# Patient Record
Sex: Female | Born: 1945 | Race: White | Hispanic: No
Health system: Northeastern US, Academic
[De-identification: ages and names within clinical notes are randomized; demographics above are authoritative.]

---

## 2020-04-06 ENCOUNTER — Ambulatory Visit: Admit: 2020-04-06 | Payer: PRIVATE HEALTH INSURANCE | Attending: Cardiovascular Disease | Primary: Internal Medicine

## 2020-04-06 ENCOUNTER — Encounter: Admit: 2020-04-06 | Payer: PRIVATE HEALTH INSURANCE | Attending: Cardiovascular Disease | Primary: Internal Medicine

## 2020-04-06 DIAGNOSIS — F488 Other specified nonpsychotic mental disorders: Secondary | ICD-10-CM

## 2020-04-06 DIAGNOSIS — I10 Essential (primary) hypertension: Secondary | ICD-10-CM

## 2020-04-06 DIAGNOSIS — M1990 Unspecified osteoarthritis, unspecified site: Secondary | ICD-10-CM

## 2020-04-06 DIAGNOSIS — E7800 Pure hypercholesterolemia, unspecified: Secondary | ICD-10-CM

## 2020-04-06 DIAGNOSIS — I471 SVT (supraventricular tachycardia): Secondary | ICD-10-CM

## 2020-04-06 DIAGNOSIS — T7840XA Allergy, unspecified, initial encounter: Secondary | ICD-10-CM

## 2020-04-06 DIAGNOSIS — R079 Chest pain, unspecified: Secondary | ICD-10-CM

## 2020-04-06 DIAGNOSIS — I499 Cardiac arrhythmia, unspecified: Secondary | ICD-10-CM

## 2020-04-06 DIAGNOSIS — R001 Bradycardia, unspecified: Secondary | ICD-10-CM

## 2020-04-06 DIAGNOSIS — R55 Syncope and collapse: Secondary | ICD-10-CM

## 2020-04-06 DIAGNOSIS — K219 Gastro-esophageal reflux disease without esophagitis: Secondary | ICD-10-CM

## 2020-04-06 DIAGNOSIS — F32A Depression: Secondary | ICD-10-CM

## 2020-04-06 DIAGNOSIS — I517 Cardiomegaly: Secondary | ICD-10-CM

## 2020-04-06 DIAGNOSIS — M797 Fibromyalgia: Secondary | ICD-10-CM

## 2020-04-06 DIAGNOSIS — M8580 Other specified disorders of bone density and structure, unspecified site: Secondary | ICD-10-CM

## 2020-04-06 DIAGNOSIS — I679 Cerebrovascular disease, unspecified: Secondary | ICD-10-CM

## 2020-04-06 NOTE — Progress Notes
TELEPHONE VISIT: For this visit the clinician and patient were present via telephone (audio only).Patient counseled on available options for visit type; Patient elected telephone visit; Patient consent given for telephone visit: YesPatient Identity was confirmed during this call.? Other individuals actively participating in the telephone encounter and their name/relation to the patient: NoneTotal time spent in medical telephone consultation: 23 minutesBecause this visit was completed over telephone, a hands-on physical exam was not performed.? Patient understands and knows to call back if condition changes. 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 In [facility], she was given Metorpolol 12.5mg  QD and Losartan 12.5mg  QD for elevated BPs there, but has since has dizziness. Her home SBPs have been in the 120s.Problem list:   Patient Active Problem List ? Diagnosis Date Noted ? Multiple closed fractures of metatarsal bone of left foot with routine healing, subsequent encounter 07/16/2017 ? Multiple closed fractures of metatarsal bone of left foot, initial encounter 06/25/2017 ? Palpitations 06/05/2017 ? SVT (supraventricular tachycardia) (HC Code) 06/26/2016 ? CVD (cerebrovascular disease) 06/26/2016 ? Bradycardia 06/26/2016 ? Fibromyalgia ? ? Depression ? ? GERD (gastroesophageal reflux disease) ? ? Arthritis ? ? HTN (hypertension) ? ? Osteopenia  Medications: reviewedAssessment74yo WF with HTN, prior SVT, and mild-moderate carotid stenosis, has had dizziness due to Metoprolol and Losartan?Plan1. Continue ASA and Amlodipine without Metoprolol or Losartan for now2. Home BPs and call if elevated3. F/u soon with Dr Massie Maroon. F/u here in 1 year or sooner if needed.

## 2020-04-07 DIAGNOSIS — I471 Supraventricular tachycardia: Secondary | ICD-10-CM

## 2020-04-19 ENCOUNTER — Telehealth: Admit: 2020-04-19 | Payer: PRIVATE HEALTH INSURANCE | Attending: Cardiovascular Disease | Primary: Internal Medicine

## 2020-04-19 ENCOUNTER — Encounter: Admit: 2020-04-19 | Payer: PRIVATE HEALTH INSURANCE | Attending: Cardiovascular Disease | Primary: Internal Medicine

## 2020-04-19 MED ORDER — FUROSEMIDE 40 MG TABLET
40 mg | ORAL_TABLET | Freq: Every day | ORAL | 1 refills | Status: AC
Start: 2020-04-19 — End: 2020-05-12

## 2020-04-19 NOTE — Telephone Encounter
Please answer for Dr. Parke Simmers away this week.I spoke to this pt who is C/o last 3-4 days notes swelling in her lower legs, ankles and feet. Pt has noted a 5 lb weight gain over the last 5 days, she said it is all fluid. Pt is not on any Diuretics. She has an OV on 04/26/20, but the weight gain is very uncomfortable.Please advise.

## 2020-04-19 NOTE — Telephone Encounter
Pt has leg and feet swelling she would like to see doctor

## 2020-04-19 NOTE — Telephone Encounter
I sent prescription for Lasix 40 milligrams daily.  She should take it for 1 week.  She should also follow low-salt diet and elevate legs.  She has appointment here in 1 week.

## 2020-04-19 NOTE — Telephone Encounter
Spoke to this pt and gave her the message from Dr. Plavec

## 2020-04-26 ENCOUNTER — Ambulatory Visit: Admit: 2020-04-26 | Payer: PRIVATE HEALTH INSURANCE | Attending: Cardiovascular Disease | Primary: Internal Medicine

## 2020-04-26 ENCOUNTER — Encounter: Admit: 2020-04-26 | Payer: PRIVATE HEALTH INSURANCE | Attending: Cardiovascular Disease | Primary: Internal Medicine

## 2020-04-26 DIAGNOSIS — I517 Cardiomegaly: Secondary | ICD-10-CM

## 2020-04-26 DIAGNOSIS — M1990 Unspecified osteoarthritis, unspecified site: Secondary | ICD-10-CM

## 2020-04-26 DIAGNOSIS — F488 Other specified nonpsychotic mental disorders: Secondary | ICD-10-CM

## 2020-04-26 DIAGNOSIS — R079 Chest pain, unspecified: Secondary | ICD-10-CM

## 2020-04-26 DIAGNOSIS — I499 Cardiac arrhythmia, unspecified: Secondary | ICD-10-CM

## 2020-04-26 DIAGNOSIS — R55 Syncope and collapse: Secondary | ICD-10-CM

## 2020-04-26 DIAGNOSIS — K219 Gastro-esophageal reflux disease without esophagitis: Secondary | ICD-10-CM

## 2020-04-26 DIAGNOSIS — I10 Essential (primary) hypertension: Secondary | ICD-10-CM

## 2020-04-26 DIAGNOSIS — R600 Localized edema: Secondary | ICD-10-CM

## 2020-04-26 DIAGNOSIS — E7800 Pure hypercholesterolemia, unspecified: Secondary | ICD-10-CM

## 2020-04-26 DIAGNOSIS — M8580 Other specified disorders of bone density and structure, unspecified site: Secondary | ICD-10-CM

## 2020-04-26 DIAGNOSIS — M797 Fibromyalgia: Secondary | ICD-10-CM

## 2020-04-26 DIAGNOSIS — T7840XA Allergy, unspecified, initial encounter: Secondary | ICD-10-CM

## 2020-04-26 DIAGNOSIS — F32A Depression: Secondary | ICD-10-CM

## 2020-04-26 MED ORDER — CYCLOSPORINE 0.05 % EYE DROPS IN A DROPPERETTE
0.05 % | Freq: Two times a day (BID) | OPHTHALMIC | Status: AC
Start: 2020-04-26 — End: ?

## 2020-04-26 NOTE — Progress Notes
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 In [facility], she was given Metoprolol 12.5mg  QD and Losartan 12.5mg  QD for elevated BPs there, but has since has dizziness. Her home SBPs have been in the 120s. Metoprolol and Losartan were stopped 04/06/2020 with significant improvement in her dizziness. More recently, she developed edema which has resolved with Furosemide.Problem list:Patient Active Problem List  Diagnosis Date Noted ? Multiple closed fractures of metatarsal bone of left foot with routine healing, subsequent encounter 07/16/2017 ? Multiple closed fractures of metatarsal bone of left foot, initial encounter 06/25/2017 ? Palpitations 06/05/2017 ? SVT (supraventricular tachycardia) (HC Code) 06/26/2016 ? CVD (cerebrovascular disease) 06/26/2016 ? Bradycardia 06/26/2016 ? Fibromyalgia  ? Depression  ? GERD (gastroesophageal reflux disease)  ? Arthritis  ? HTN (hypertension)  ? Osteopenia  ? Localized edema 04/26/2020 Medications:Current Outpatient Medications Medication Sig Dispense Refill ? amLODIPine (NORVASC) 5 mg tablet TAKE 1 TABLET BY MOUTH EVERY DAY 90 tablet 3 ? aspirin 81 MG EC tablet Take 81 mg by mouth daily.   ? calcium carbonate (OS-CAL) 600 mg (1,500 mg) Tab tablet Take 1,200 mg by mouth daily.   ? CALCIUM CARBONATE/VITAMIN D3 (VITAMIN D-3 ORAL) Take by mouth.   ? celecoxib (CELEBREX) 100 mg capsule Take 1 capsule (100 mg total) by mouth daily. 90 capsule 0 ? cyanocobalamin (VITAMIN B-12) 1000 MCG tablet Take 1,000 mcg by mouth daily.   ? cycloSPORINE (RESTASIS) 0.05 % ophthalmic emulsion Place 1 drop into both eyes 2 (two) times daily.   ? esomeprazole (NEXIUM) 40 MG capsule Take 40 mg by mouth daily.   ? furosemide (LASIX) 40 mg tablet Take 1 tablet (40 mg total) by mouth daily. 30 tablet 0 ? hydrOXYchloroQUINE (PLAQUENIL) 200 mg tablet Take 1 tablet (200 mg total) by mouth daily. 90 tablet 3 ? alendronate (FOSAMAX) 70 MG tablet TAKE 1 TAB(S) ONCE A WEEK ORALLY 28 DAY(S)  3 ? ALPRAZolam (XANAX) 0.25 MG tablet Take 0.25 mg by mouth nightly as needed.   ? diclofenac (VOLTAREN) 1 % Gel Apply topically.   ? MAGNESIUM CITRATE ORAL Take by mouth.   ? oxyCODONE-acetaminophen (PERCOCET) 5-325 mg per tablet Take 1 tablet by mouth every 6 (six) hours as needed. 30 tablet 0 ? oxyCODONE-acetaminophen (PERCOCET) 5-325 mg per tablet Take 1 tablet by mouth every 6 (six) hours as needed. 20 tablet 0 ? rOPINIRole (REQUIP) 0.25 MG tablet TALE 1 TAB(S) ORALLY TWICE DAILY  1 No current facility-administered medications for this visit.  Allergies:She is allergic to penicillins.The following ROS documentation is the transcribed Review of System completed by the patient at intake.Review of Systems Constitution: Negative for chills, decreased appetite, diaphoresis, fever, malaise/fatigue, weight gain and weight loss. Cardiovascular: Negative for chest pain, claudication, dyspnea on exertion, irregular heartbeat, leg swelling, near-syncope, orthopnea, palpitations, paroxysmal nocturnal dyspnea and syncope. Respiratory: Negative for cough, hemoptysis, shortness of breath, snoring and wheezing.  Neurological: Positive for dizziness.  Vital Signs: BP 126/68 (Site: l a, Position: Sitting, Cuff Size: Medium)  - Pulse (!) 56  - Temp 97.2 ?F (36.2 ?C)  - Ht 5' 7 (1.702 m)  - Wt 80 kg  - SpO2 98%  - BMI 27.63 kg/m? Wt Readings from Last 3 Encounters: 04/26/20 80 kg 08/13/19 77.1 kg  07/16/19 77.1 kg Physical Exam Constitutional: She appears well-developed and well-nourished. HENT: Head: Normocephalic and atraumatic. Eyes: Conjunctivae are normal. Neck: No JVD present. Cardiovascular: Normal rate and regular rhythm. No murmur heard.Pulmonary/Chest: Effort normal and breath sounds normal. Abdominal: Soft. Bowel sounds are normal. Musculoskeletal:       General: No edema. Neurological: She is alert. Skin: Skin is warm and dry. Psychiatric: She has a normal mood and affect. Labs:Lipid PanelLab Results Component Value Date  CHOL 163 11/28/2016  CHOL 181 09/22/2016  CHOL 197 09/29/2014 Lab Results Component Value Date  HDL 79 11/28/2016  HDL 76 45/40/9811  HDL 84 91/47/8295 Lab Results Component Value Date  LDL 67 11/28/2016  LDL 93 62/13/0865  LDL 98 78/46/9629 Lab Results Component Value Date  TRIG 84 11/28/2016  TRIG 62 09/22/2016  TRIG 73 09/29/2014 Lab Results Component Value Date  CHOLHDL 2.1 11/28/2016  CHOLHDL 2.4 09/22/2016  CHOLHDL 2 09/29/2014 CBCLab Results Component Value Date  WBC 6.4 06/18/2019  HGB 13.6 06/18/2019  HCT 40.5 06/18/2019  MCV 96.4 (H) 06/18/2019  PLT 354 06/18/2019 Liver Function TestsLab Results Component Value Date  ALT 21 06/18/2019  AST 18 06/18/2019  ALKPHOS 56 06/18/2019  BILITOT 0.3 06/18/2019 Chemistry  Chemistry  Lab Results Component Value Date  NA 137 06/18/2019  K 4.7 06/18/2019  CL 102 06/18/2019  CO2 26 06/18/2019  BUN 14 06/18/2019  CREATININE 0.84 06/18/2019  GLU 84 06/18/2019  Lab Results Component Value Date  CALCIUM 9.3 06/18/2019  ALKPHOS 56 06/18/2019  AST 18 06/18/2019  ALT 21 06/18/2019  BILITOT 0.3 06/18/2019  EKG: SB PVCResults for orders placed or performed in visit on 04/26/20 EKG (Clinic Performed) Result Value Ref Range  ECG - HEART RATE 56 bpm  ECG - QRS Interval 82 ms  ECG - QT Interval 408 ms  ECG - QTC Interval 388 ms ECG - P Axis 64 deg  ECG - QRS Axis 20 deg  ECG - T Wave Axis 57 deg  ECG -- P-R Interval 169 msec  ECG - SEVERITY Borderline ECG severity ECHO:Results for orders placed or performed during the hospital encounter of 12/24/17 Echo 2D Complete w Doppler and CFI if Indicated Contrast and or 3D Result Value Ref Range  Reported Biplane EF% 68 %  Narrative   * Quality of the study was good.* Normal left ventricular cavity size.  Normal left ventricular systolic function.  Asymmetric septal hypertrophy is noted.  LVEF calculated by biplane Simpson's was 68%.* Normal right ventricular cavity size and systolic function.* Aortic sclerosis without stenosis.  Trace aortic regurgitation.* Mild mitral regurgitation.* Mild tricuspid regurgitation.* No evidence of pericardial effusion.* No prior study available for comparison. Assessment74yo WF with?HTN, prior SVT, and mild-moderate carotid stenosis, has improved dizziness without Metoprolol and Losartan, and resolved edema with Furosemide. Her edema likely was due to a higher salt intake during a recent trip, in addition to her usual Amlodipine but also the hot weather.Plan1. Continue current medications for now but would consider reducing Furosemide2. Echocardiogram, then arrange follow up I encouraged the patient to call me if they have any questions and more importantly any new symptoms.

## 2020-04-27 DIAGNOSIS — I679 Cerebrovascular disease, unspecified: Secondary | ICD-10-CM

## 2020-05-10 ENCOUNTER — Inpatient Hospital Stay: Admit: 2020-05-10 | Discharge: 2020-05-10 | Payer: PRIVATE HEALTH INSURANCE | Primary: Internal Medicine

## 2020-05-10 DIAGNOSIS — R600 Localized edema: Secondary | ICD-10-CM

## 2020-05-10 DIAGNOSIS — I10 Essential (primary) hypertension: Secondary | ICD-10-CM

## 2020-05-11 ENCOUNTER — Telehealth: Admit: 2020-05-11 | Payer: PRIVATE HEALTH INSURANCE | Attending: Cardiovascular Disease | Primary: Internal Medicine

## 2020-05-11 ENCOUNTER — Encounter: Admit: 2020-05-11 | Payer: PRIVATE HEALTH INSURANCE | Attending: Cardiovascular Disease | Primary: Internal Medicine

## 2020-05-11 ENCOUNTER — Encounter: Admit: 2020-05-11 | Payer: PRIVATE HEALTH INSURANCE | Attending: Internal Medicine | Primary: Internal Medicine

## 2020-05-11 DIAGNOSIS — Z1231 Encounter for screening mammogram for malignant neoplasm of breast: Secondary | ICD-10-CM

## 2020-05-11 NOTE — Telephone Encounter
Echo is unremarkable. F/u in 1 year

## 2020-05-12 MED ORDER — FUROSEMIDE 40 MG TABLET
40 mg | ORAL_TABLET | 4 refills | Status: AC
Start: 2020-05-12 — End: 2021-03-27

## 2020-06-21 ENCOUNTER — Encounter: Admit: 2020-06-21 | Payer: PRIVATE HEALTH INSURANCE | Attending: Cardiovascular Disease | Primary: Internal Medicine

## 2020-06-22 MED ORDER — AMLODIPINE 5 MG TABLET
5 mg | ORAL_TABLET | 4 refills | Status: AC
Start: 2020-06-22 — End: 2021-07-21

## 2020-07-06 ENCOUNTER — Ambulatory Visit: Admit: 2020-07-06 | Payer: PRIVATE HEALTH INSURANCE | Primary: Internal Medicine

## 2020-08-23 IMAGING — MG MAMMOGRAPHY SCREENING BILATERAL 3D TOMOSYNTHESIS WITH CAD
8 series · 8 of 24 positions shown · non-contrast
Comparison: 09/03/2018 
BREAST DENSITY: (Level C) The breasts are heterogeneously dense, which may 
obscure small masses.

MAMMOGRAPHY SCREENING BILATERAL 3D TOMOSYNTHESIS WITH CAD, 08/23/2020 [DATE]: 
CLINICAL INDICATION: Screening. Breast cancer mother. The 9 LEFT breast biopsy 3 
years ago.
TECHNIQUE: Bilateral oblique mediolateral and craniocaudal full field digital 
mammogram and 3-D Tomosynthesis were obtained.  In addition, computer-aided 
detection was utilized.

[R CC]
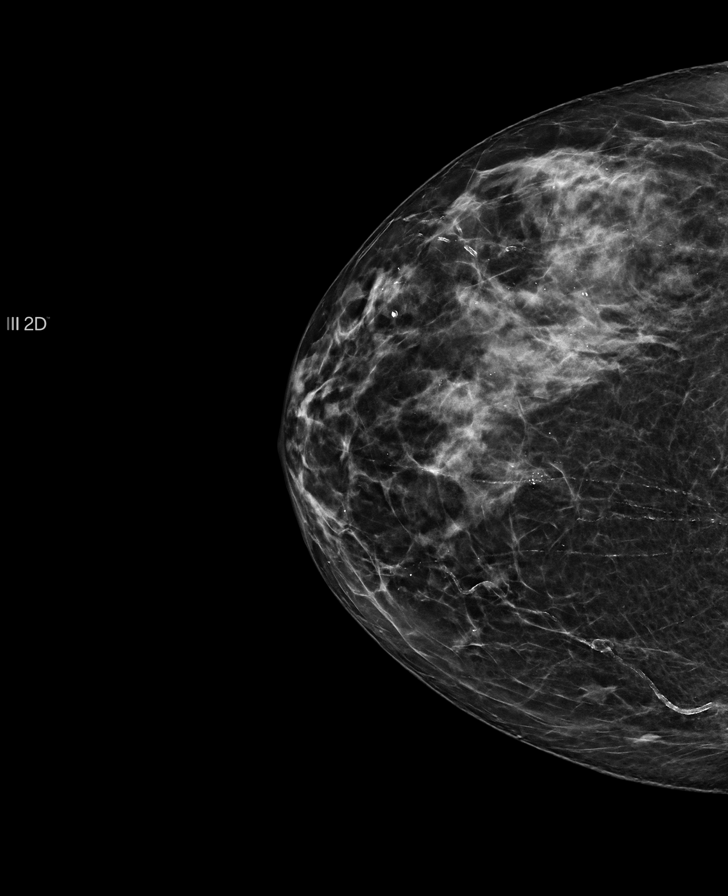

[L MLO]
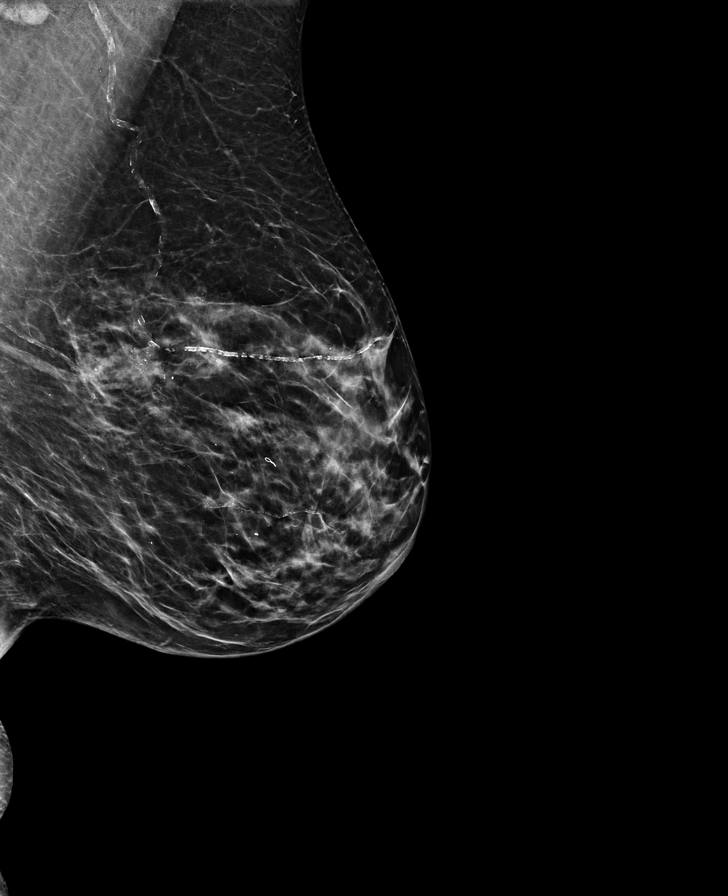

[L CC]
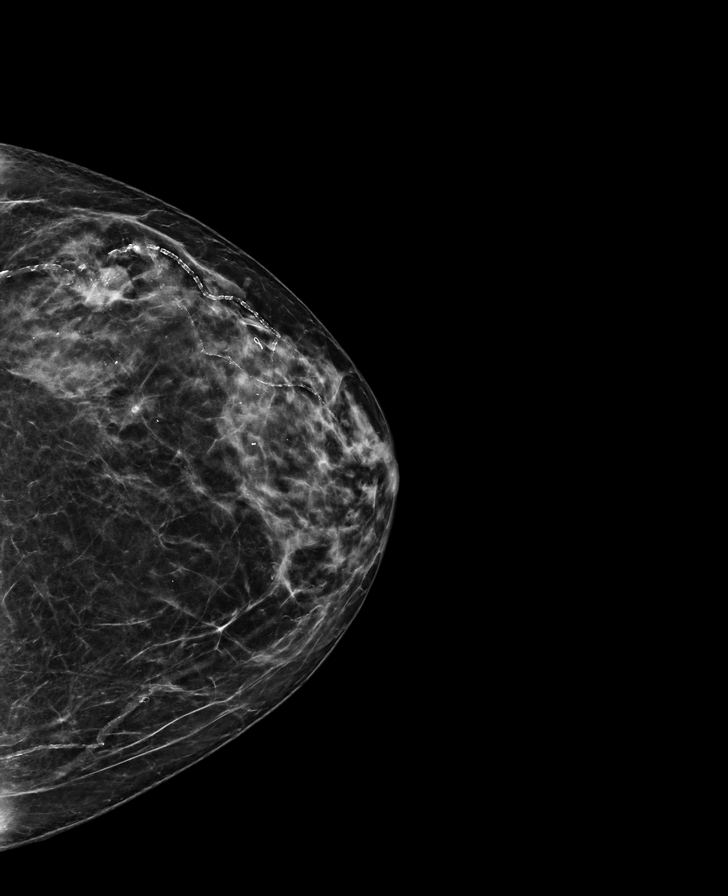

[R MLO]
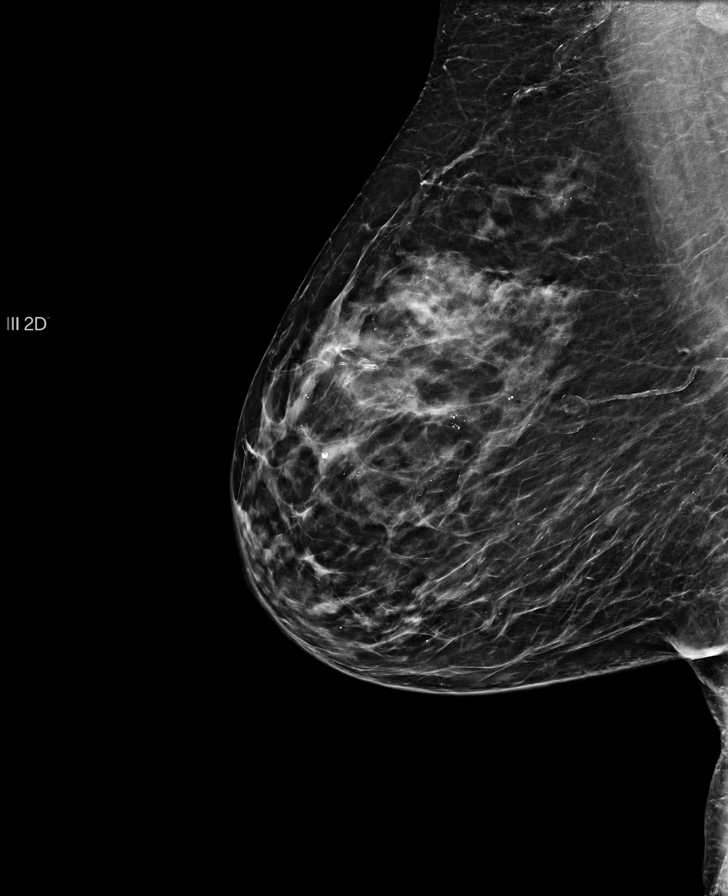

[R MLO tomo · tomo slice 37/73.0]
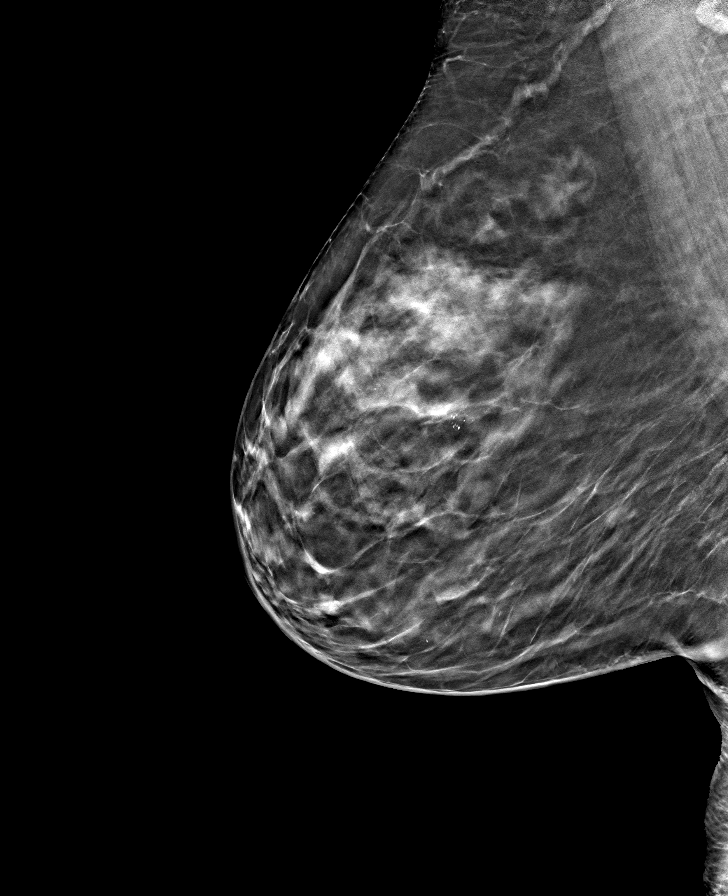

[R CC tomo · tomo slice 31/61.0]
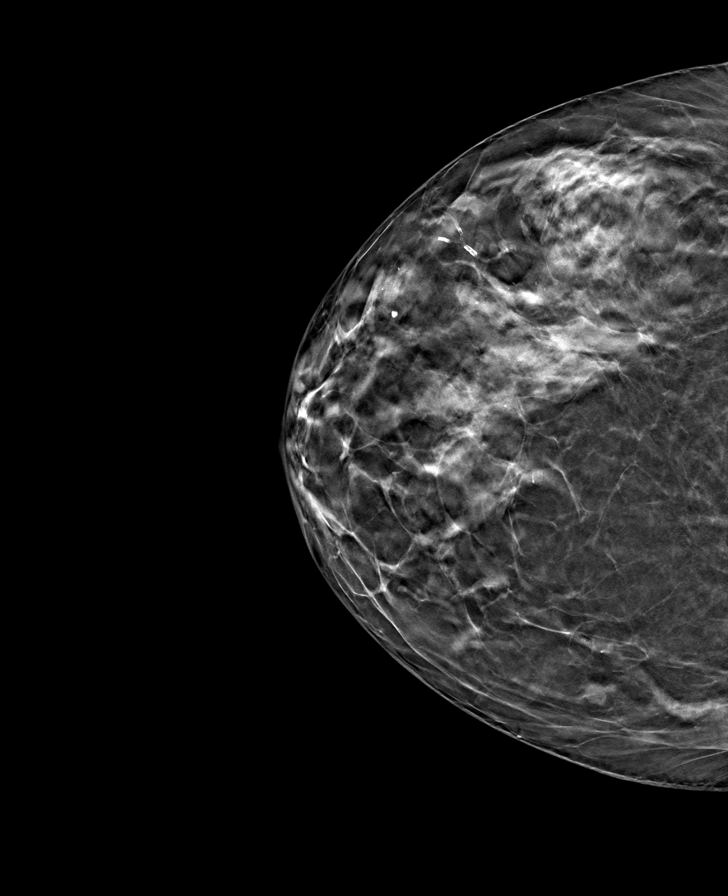

[L CC tomo · tomo slice 33/64.0]
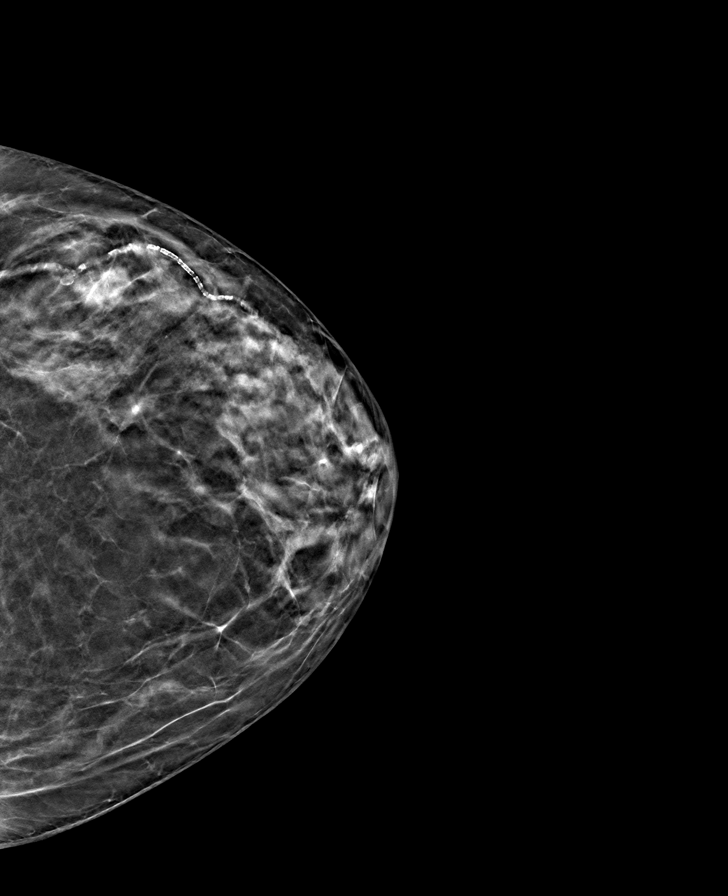

[L MLO tomo · tomo slice 37/72.0]
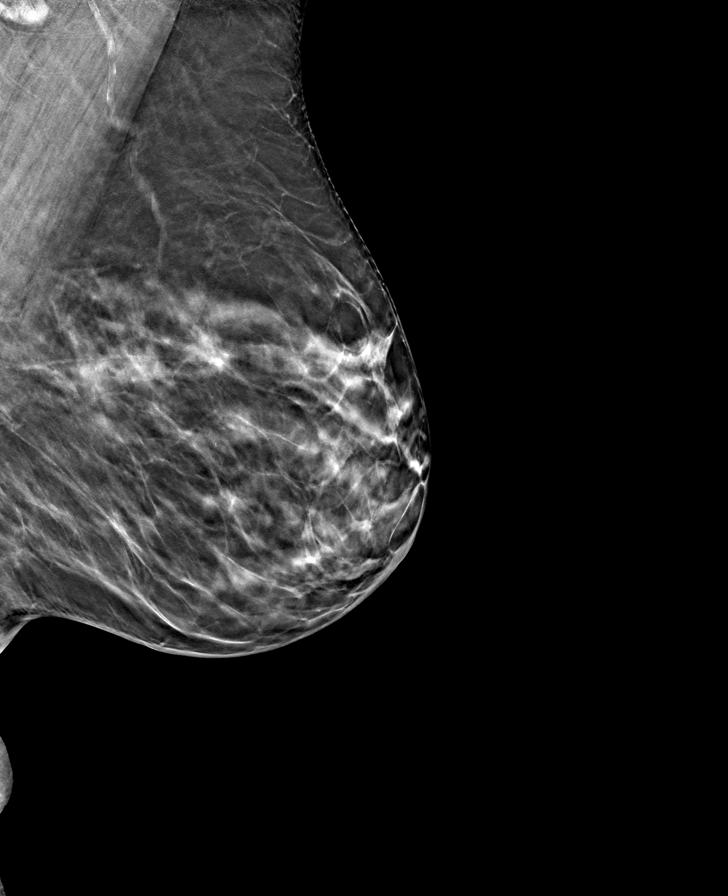

[8 of 24 positions shown; findings below may reference images not displayed]

FINDINGS: Developing cluster of calcifications in the lateral LEFT breast. 
Magnification views have been further evaluation. 
Cluster of calcifications medial to the midline of the RIGHT breast appears 
stable. Extensive, scattered benign-appearing calcifications. Arterial 
calcifications.
IMPRESSION: (BI-RADS 0) Incomplete. Further evaluation will be performed as discussed above, 
and the results will be reported separately.

## 2020-09-16 IMAGING — MG MAMMOGRAPHY DIAGNOSTIC LEFT 3D TOMOSYNTHESIS WITH CAD
3 series · 3 of 3 positions shown · non-contrast
Comparison: 08/23/2020 and dating back to 07/08/2007 
BREAST DENSITY: (Level C) The breasts are heterogeneously dense, which may 
obscure small masses.

MAMMOGRAPHY DIAGNOSTIC LEFT 3D TOMOSYNTHESIS WITH CAD, 09/16/2020 [DATE]: 
CLINICAL INDICATION: Callback from screening mammogram
TECHNIQUE: Unilateral left breast oblique medial lateral and craniocaudal full 
field digital mammogram and 3-D Tomosynthesis were obtained. In addition, 
computer-aided detection was utilized.

[L MLO]
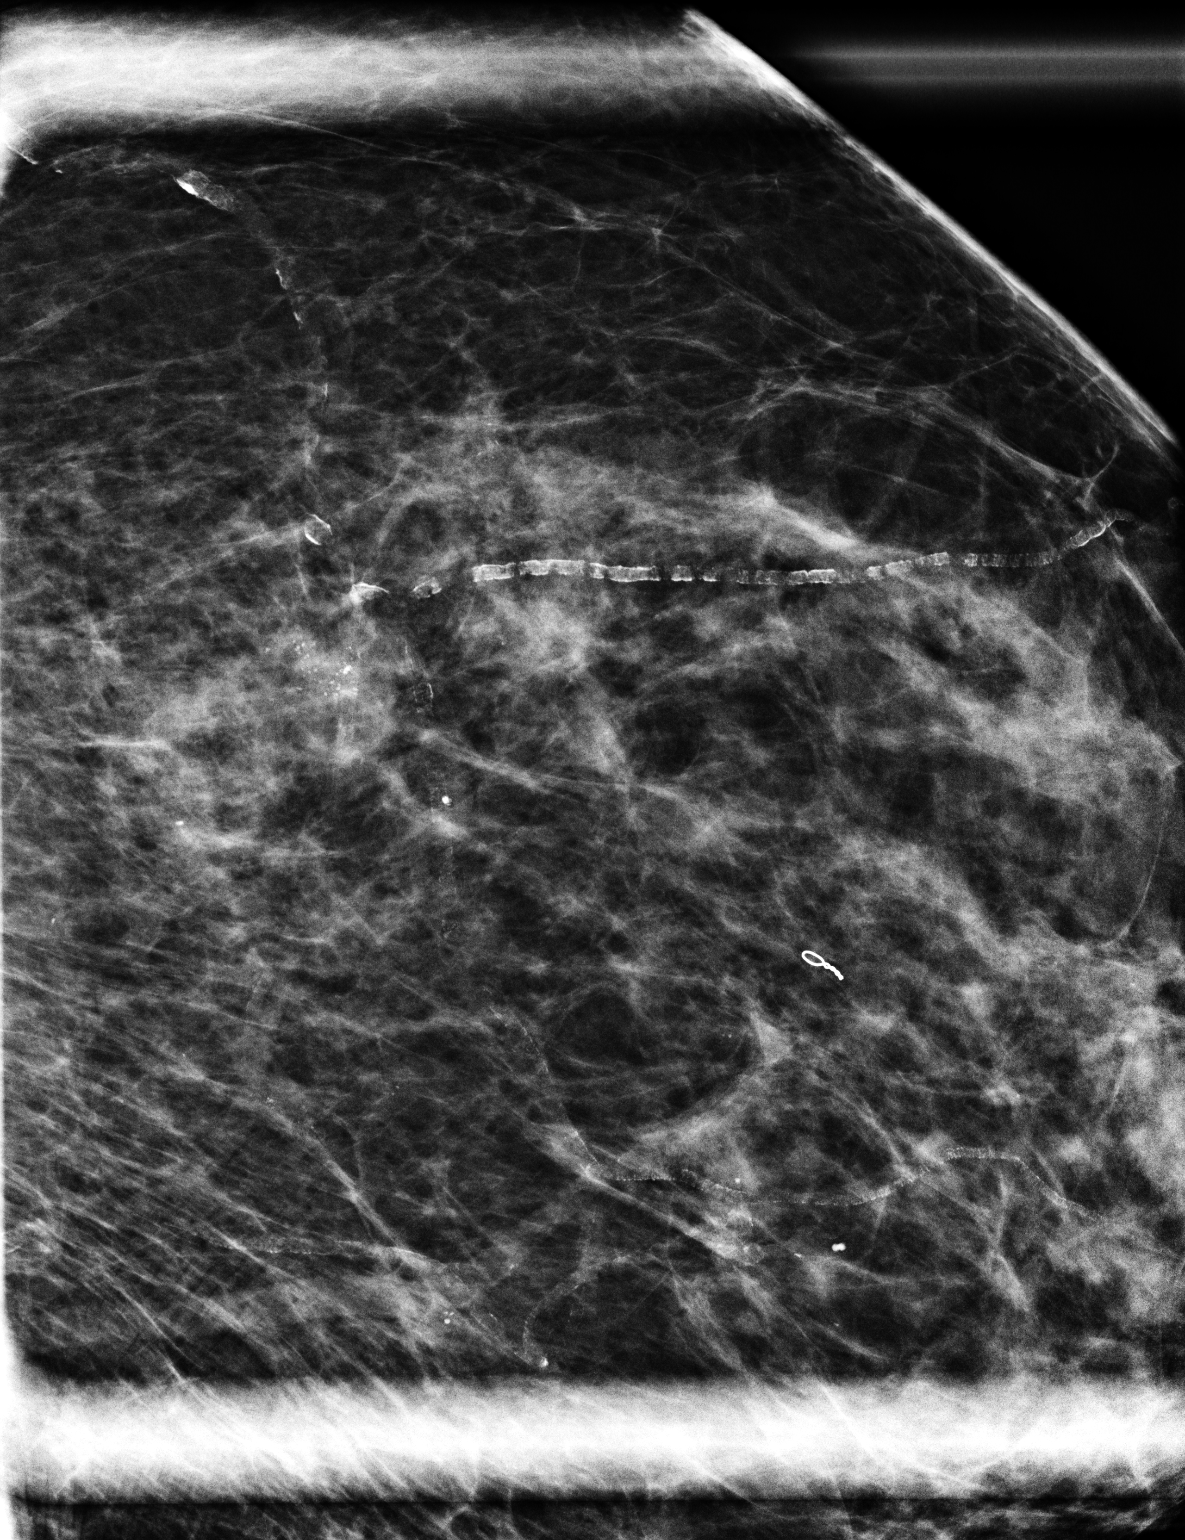

[L CC]
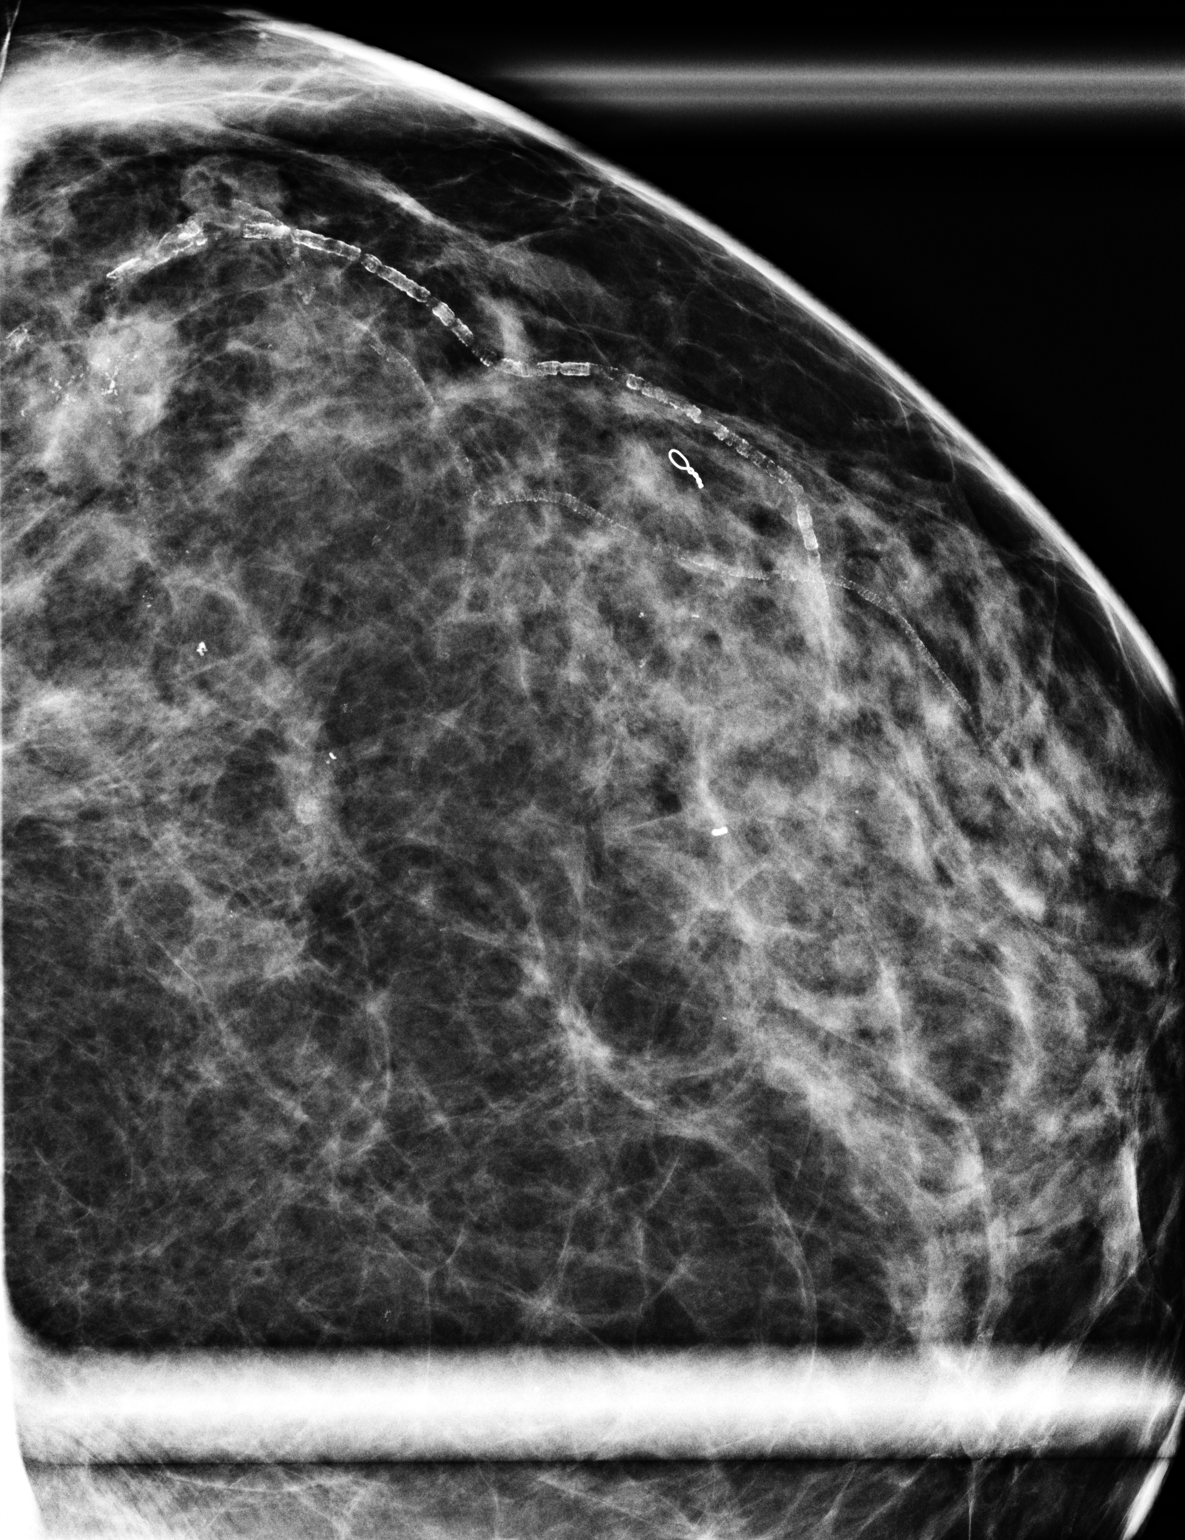

[L ML]
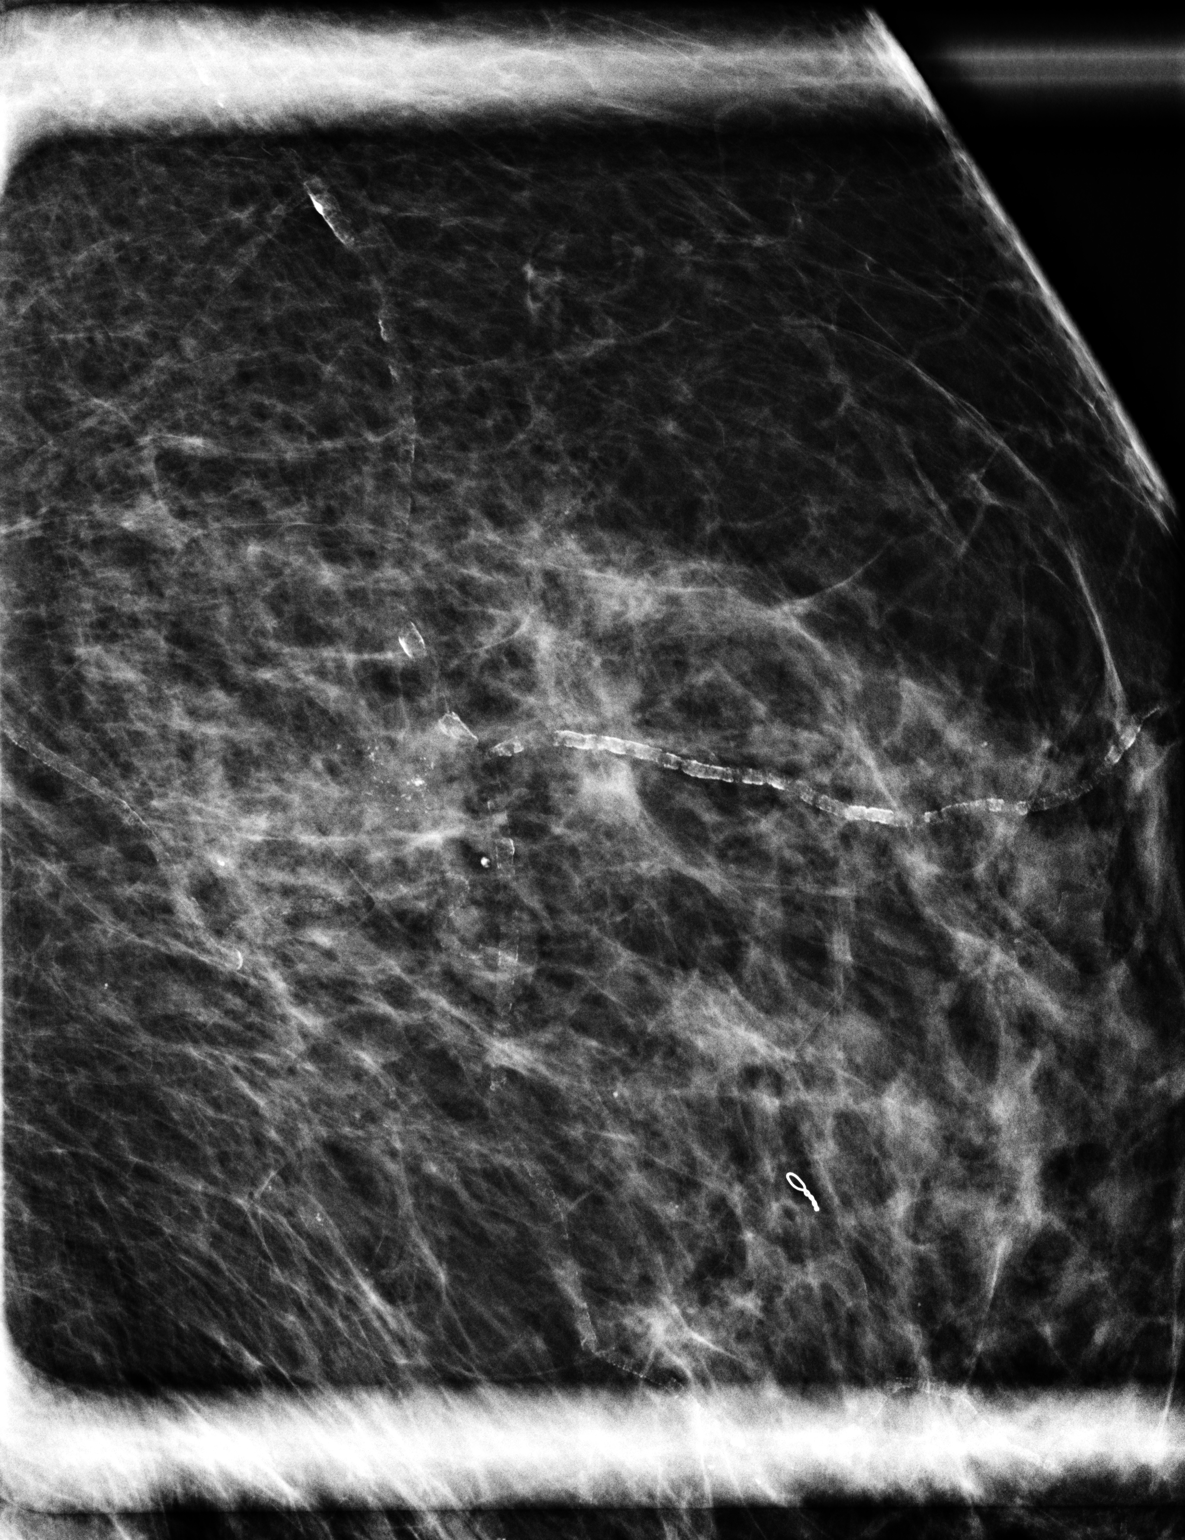

[3 of 3 positions shown; findings below may reference images not displayed]

FINDINGS: Again seen is the clustered microcalcifications within the lateral 
left breast nearly straight back from the nipple. These are located 
approximately 9 cm from the level of the nipple. Margination is somewhat 
amorphous. Stereotactic sampling is recommended.
IMPRESSION: (BI-RADS 4) Suspicious abnormality. Biopsy is recommended.

## 2021-03-21 ENCOUNTER — Telehealth: Admit: 2021-03-21 | Payer: PRIVATE HEALTH INSURANCE | Attending: Cardiovascular Disease | Primary: Internal Medicine

## 2021-03-21 NOTE — Telephone Encounter
Dr. Normajean Glasgow patient requested an appt via answering service. Patient states she has been experiencing headaches, dizziness, unsteady on her feet, tingling in her hands and feet. She saw her PCP on 5/16 and was advised to see her cardiologist as soon as possible. Her contact number is [phone].

## 2021-03-21 NOTE — Telephone Encounter
Spoke with ptShe reports dizziness and tingling in her hands and feetStates this happened last year but was not as severeShe reports symptoms have been intermittent and ongoing but have worsened within the past couple of weeksShe denies headachesDenies unilateral weakness, just states she feels unsteady (not using cane or walker)No difficulty with speechStates she is dizzy all day but notes her dizziness worsens when she standsShe takes amlodipine 5 mg QDStates she takes 1/2 tab lasix (20 mg) as needed for swellingReports recent psychosocial stressors (recent mastectomy, son's death)BP today 150/67, HR 61BP yesterday 155/77, HR 53Not adding salt to mealsHas upcoming OV on 6/15/22Would like to be seen soonerAlso endorses severe fleeting chest pain that occurred a few weeks agoReviewed to call 911 for s/s of stroke (ie sudden severe headache, speech difficulty, unilateral weakness)Please advise

## 2021-03-21 NOTE — Telephone Encounter
Brier, Sherie Don., MD  [facility] Nurse Triage Pool 7 minutes ago (11:37 AM) JBPlease have her call to move up the appt  Message text

## 2021-03-27 ENCOUNTER — Ambulatory Visit: Admit: 2021-03-27 | Payer: PRIVATE HEALTH INSURANCE | Attending: Cardiovascular Disease | Primary: Internal Medicine

## 2021-03-27 ENCOUNTER — Encounter: Admit: 2021-03-27 | Payer: PRIVATE HEALTH INSURANCE | Attending: Cardiovascular Disease | Primary: Internal Medicine

## 2021-03-27 DIAGNOSIS — I10 Essential (primary) hypertension: Secondary | ICD-10-CM

## 2021-03-27 DIAGNOSIS — M8580 Other specified disorders of bone density and structure, unspecified site: Secondary | ICD-10-CM

## 2021-03-27 DIAGNOSIS — R55 Syncope and collapse: Secondary | ICD-10-CM

## 2021-03-27 DIAGNOSIS — F32A Depression: Secondary | ICD-10-CM

## 2021-03-27 DIAGNOSIS — M797 Fibromyalgia: Secondary | ICD-10-CM

## 2021-03-27 DIAGNOSIS — T7840XA Allergy, unspecified, initial encounter: Secondary | ICD-10-CM

## 2021-03-27 DIAGNOSIS — I471 SVT (supraventricular tachycardia): Secondary | ICD-10-CM

## 2021-03-27 DIAGNOSIS — I499 Cardiac arrhythmia, unspecified: Secondary | ICD-10-CM

## 2021-03-27 DIAGNOSIS — I517 Cardiomegaly: Secondary | ICD-10-CM

## 2021-03-27 DIAGNOSIS — F488 Other specified nonpsychotic mental disorders: Secondary | ICD-10-CM

## 2021-03-27 DIAGNOSIS — K219 Gastro-esophageal reflux disease without esophagitis: Secondary | ICD-10-CM

## 2021-03-27 DIAGNOSIS — R42 Dizziness and giddiness: Secondary | ICD-10-CM

## 2021-03-27 DIAGNOSIS — R079 Chest pain, unspecified: Secondary | ICD-10-CM

## 2021-03-27 DIAGNOSIS — M1990 Unspecified osteoarthritis, unspecified site: Secondary | ICD-10-CM

## 2021-03-27 DIAGNOSIS — E7800 Pure hypercholesterolemia, unspecified: Secondary | ICD-10-CM

## 2021-03-27 MED ORDER — LEVOTHYROXINE 25 MCG TABLET
25 MCG | Status: AC
Start: 2021-03-27 — End: ?

## 2021-03-27 MED ORDER — HYDROCHLOROTHIAZIDE 25 MG TABLET
25 mg | ORAL_TABLET | Freq: Every day | ORAL | 4 refills | Status: AC
Start: 2021-03-27 — End: 2022-03-13

## 2021-03-27 MED ORDER — LOSARTAN 25 MG TABLET
25 mg | Status: AC
Start: 2021-03-27 — End: 2021-03-27

## 2021-03-27 NOTE — Progress Notes
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 She did have a left mastectomy for breast cancer 12/2020 with reconstruction. Her son died unexpectedly 01-25-2021 at 51. A autopsy is pending.Problem list:Patient Active Problem List  Diagnosis Date Noted ? Multiple closed fractures of metatarsal bone of left foot with routine healing, subsequent encounter 07/16/2017 ? Multiple closed fractures of metatarsal bone of left foot, initial encounter 06/25/2017 ? Palpitations 06/05/2017 ? SVT (supraventricular tachycardia) (HC Code) (HC CODE) (HC Code) 06/26/2016 ? CVD (cerebrovascular disease) 06/26/2016 ? Bradycardia 06/26/2016 ? Fibromyalgia  ? Depression  ? GERD (gastroesophageal reflux disease)  ? Arthritis  ? HTN (hypertension)  ? Osteopenia  ? Dizziness 03/27/2021 ? Localized edema 04/26/2020 Medications:Current Outpatient Medications Medication Sig Dispense Refill ? alendronate (FOSAMAX) 70 MG tablet TAKE 1 TAB(S) ONCE A WEEK ORALLY 28 DAY(S)  3 ? amLODIPine (NORVASC) 5 mg tablet TAKE 1 TABLET BY MOUTH EVERY DAY 90 tablet 3 ? aspirin 81 MG EC tablet Take 81 mg by mouth daily.   ? calcium carbonate (OS-CAL) 600 mg (1,500 mg) Tab tablet Take 1,200 mg by mouth daily.   ? CALCIUM CARBONATE/VITAMIN D3 (VITAMIN D-3 ORAL) Take by mouth.   ? celecoxib (CELEBREX) 100 mg capsule Take 1 capsule (100 mg total) by mouth daily. 90 capsule 0 ? cyanocobalamin 1000 MCG tablet Take 1,000 mcg by mouth daily.   ? cycloSPORINE (RESTASIS) 0.05 % ophthalmic emulsion Place 1 drop into both eyes 2 (two) times daily.   ? diclofenac (VOLTAREN) 1 % gel Apply topically.   ? esomeprazole (NEXIUM) 40 MG capsule Take 40 mg by mouth daily.   ? hydrOXYchloroQUINE (PLAQUENIL) 200 mg tablet Take 1 tablet (200 mg total) by mouth daily. 90 tablet 3 ? levothyroxine (SYNTHROID, LEVOTHROID) 25 MCG tablet TAKE 1 TABLET BY MOUTH EVERY DAY FOR 90 DAYS   ? MAGNESIUM CITRATE ORAL Take by mouth.   ? hydroCHLOROthiazide (HYDRODIURIL) 25 mg tablet Take 1 tablet (25 mg total) by mouth daily. 90 tablet 3 No current facility-administered medications for this visit. Allergies:She is allergic to penicillins.The following ROS documentation is the transcribed Review of System completed by the patient at intake.Review of Systems Constitutional: Negative for chills, decreased appetite, diaphoresis, fever, malaise/fatigue, weight gain and weight loss. Cardiovascular: Negative for chest pain, claudication, dyspnea on exertion, irregular heartbeat, leg swelling, near-syncope, orthopnea, palpitations, paroxysmal nocturnal dyspnea and syncope. Respiratory: Negative for cough, hemoptysis, shortness of breath, snoring and wheezing.  Neurological: Positive for dizziness.  Vital Signs: BP 90/60 (Site: l a, Position: Sitting, Cuff Size: Large)  - Pulse (!) 58  - Ht 5' 7 (1.702 m)  - Wt 73.9 kg  - SpO2 98%  - BMI 25.53 kg/m? Wt Readings from Last 3 Encounters: 03/27/21 73.9 kg 04/26/20 80 kg 08/13/19 77.1 kg Physical ExamConstitutional:     Appearance: She is well-developed. HENT:    Head: Normocephalic and atraumatic. Eyes:    Conjunctiva/sclera: Conjunctivae normal. Neck:    Vascular: No JVD. Cardiovascular:    Rate and Rhythm: Normal rate and regular rhythm.    Heart sounds: No murmur heard.Pulmonary:    Effort: Pulmonary effort is normal.    Breath sounds: Normal breath sounds. Abdominal:    General: Bowel sounds are normal.  Palpations: Abdomen is soft. Skin:   General: Skin is warm and dry. Neurological:    Mental Status: She is alert. Labs:Lipid PanelLab Results Component Value Date  CHOL 163 11/28/2016  CHOL 181 09/22/2016  CHOL 197 09/29/2014 Lab Results Component Value Date  HDL 79 11/28/2016  HDL 76 09/81/1914  HDL 84 78/29/5621 Lab Results Component Value Date  LDL 67 11/28/2016  LDL 93 30/86/5784  LDL 98 69/62/9528 Lab Results Component Value Date  TRIG 84 11/28/2016  TRIG 62 09/22/2016  TRIG 73 09/29/2014 Lab Results Component Value Date  CHOLHDL 2.1 11/28/2016  CHOLHDL 2.4 09/22/2016  CHOLHDL 2 09/29/2014 CBCLab Results Component Value Date  WBC 6.4 06/18/2019  HGB 13.6 06/18/2019  HCT 40.5 06/18/2019  MCV 96.4 (H) 06/18/2019  PLT 354 06/18/2019 Liver Function TestsLab Results Component Value Date  ALT 21 06/18/2019  AST 18 06/18/2019  ALKPHOS 56 06/18/2019  BILITOT 0.3 06/18/2019 Chemistry  Chemistry  Lab Results Component Value Date  NA 137 06/18/2019  K 4.7 06/18/2019  CL 102 06/18/2019  CO2 26 06/18/2019  BUN 14 06/18/2019  CREATININE 0.84 06/18/2019  GLU 84 06/18/2019  Lab Results Component Value Date  CALCIUM 9.3 06/18/2019  ALKPHOS 56 06/18/2019  AST 18 06/18/2019  ALT 21 06/18/2019  BILITOT 0.3 06/18/2019  EKG: SB PVCResults for orders placed or performed in visit on 03/27/21 EKG (Clinic Performed) Result Value Ref Range  ECG - HEART RATE 52 bpm  ECG - QRS Interval 83 ms  ECG - QT Interval 416 ms  ECG - QTC Interval 386 ms  ECG - P Axis 44 deg  ECG - QRS Axis 16 deg  ECG - T Wave Axis 41 deg  ECG -- P-R Interval 170 msec  ECG - SEVERITY Otherwise Normal ECG severity ECHO:Results for orders placed or performed during the hospital encounter of 05/10/20 Echo 2D Complete w Doppler and CFI if Ind Image Enhancement 3D and or bubbles Result Value Ref Range  Reported Biplane EF% 66 %  Narrative   * Normal left ventricular size and systolic function. Mild concentric left ventricular hypertrophy.  No regional wall motion abnormalities.  LVEF calculated by biplane Simpson's was 66%.* Normal diastolic function and filling pressures.* Normal right ventricular cavity size, systolic function and wall motion.* Atria are normal in size.* Trace aortic regurgitation.* Mild mitral regurgitation.* Mild tricuspid regurgitation.  Estimated pulmonary artery systolic pressure, assuming a right atrial pressure of 3 mmHg, equals 19 mmHg.* All visible segments of the aorta are normal in size.* Compared with the prior study, dated 12/24/2017, there is no significant change. Assessment75yo WF with?HTN, bradycardia, prior SVT, and mild-moderate carotid stenosis, has dizziness since restarting Losartan, and mild edema.Plan1. Stop Losartan. She will send me a My Chart message in 2 weeks. If her dizziness does not resolve, then MCT to look for worsening bradycardia2. Change Furosemide prn to HCTZ 25mg  QAM3. Continue Amlodipine I encouraged the patient to call me if they have any questions and more importantly any new symptoms.

## 2021-03-28 DIAGNOSIS — I471 Supraventricular tachycardia: Secondary | ICD-10-CM

## 2021-03-28 DIAGNOSIS — R001 Bradycardia, unspecified: Secondary | ICD-10-CM

## 2021-04-19 ENCOUNTER — Ambulatory Visit: Admit: 2021-04-19 | Payer: PRIVATE HEALTH INSURANCE | Attending: Cardiovascular Disease | Primary: Internal Medicine

## 2021-04-26 ENCOUNTER — Ambulatory Visit: Admit: 2021-04-26 | Payer: PRIVATE HEALTH INSURANCE | Attending: Cardiovascular Disease | Primary: Internal Medicine

## 2021-05-06 MED ORDER — TRIAMCINOLONE ACETONIDE 0.1 % TOPICAL CREAM
0.1 | Freq: Two times a day (BID) | TOPICAL | 1 refills | 15.00000 days | Status: AC
Start: 2021-05-06 — End: ?

## 2021-05-06 MED ORDER — METHYLPREDNISOLONE 4 MG TABLETS IN A DOSE PACK
4 | ORAL_TABLET | ORAL | 1 refills | 6.00000 days | Status: AC
Start: 2021-05-06 — End: ?

## 2021-06-15 ENCOUNTER — Encounter: Admit: 2021-06-15 | Payer: PRIVATE HEALTH INSURANCE | Attending: Cardiovascular Disease | Primary: Internal Medicine

## 2021-07-21 ENCOUNTER — Encounter: Admit: 2021-07-21 | Payer: PRIVATE HEALTH INSURANCE | Attending: Cardiovascular Disease | Primary: Internal Medicine

## 2021-07-21 MED ORDER — AMLODIPINE 5 MG TABLET
5 mg | ORAL_TABLET | 2 refills | Status: AC
Start: 2021-07-21 — End: 2022-08-07

## 2021-07-21 NOTE — Telephone Encounter
Rx pended for MD signature

## 2021-11-21 IMAGING — MR MRI LUMBAR SPINE WITHOUT CONTRAST
4 of 6 series · 22 of 48 positions shown · IV contrast (gadolinium)
Comparison: None

________________________________________________________________________________________________ 
MRI LUMBAR SPINE WITHOUT CONTRAST, 11/21/2021 [DATE]: 
CLINICAL INDICATION: Spinal stenosis. Chronic low back pain radiating to the 
right leg. Bilateral leg cramping. History of breast carcinoma.
TECHNIQUE: Multiplanar, multiecho position MR images of the lumbar spine were 
performed without intravenous gadolinium enhancement. Patient was scanned on a 
1.5T magnet.

[Series 101: survey · axial · 10.0mm · 1.25mm/px · z∈[-33,+201]mm · 5 of 10 slices shown]
[im 1/10]
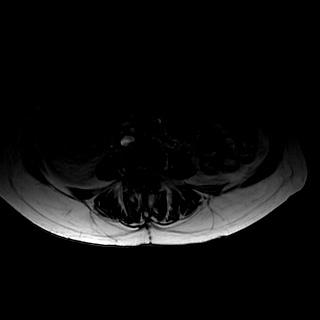
[im 3/10]
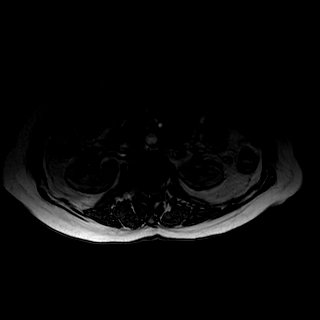
[im 5/10]
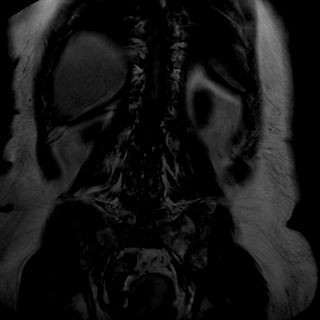
[im 7/10]
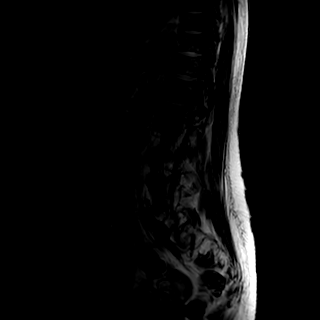
[im 10/10]
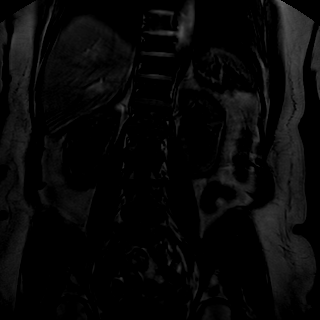

[Series 201: t2w_cor-surv · coronal · 6.0mm · 0.58mm/px · 5 of 10 slices shown]
[im 1/10]
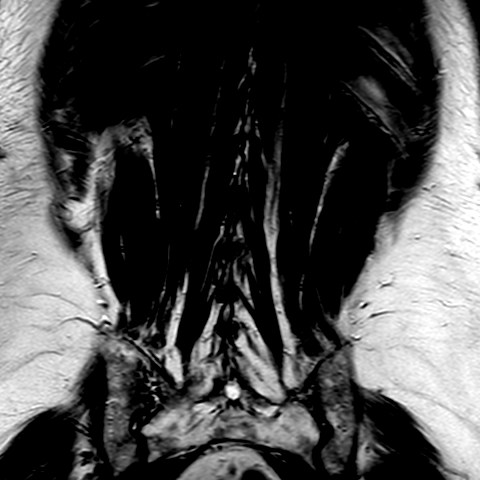
[im 3/10]
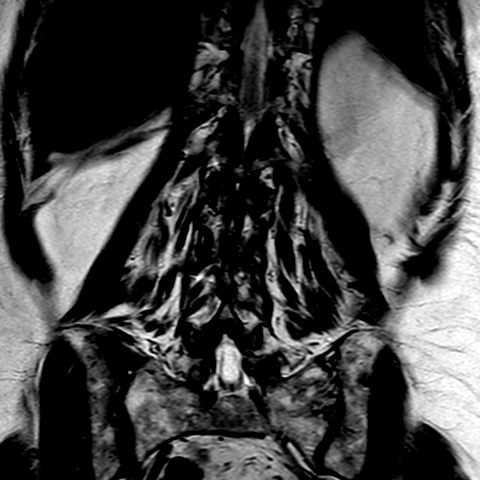
[im 5/10]
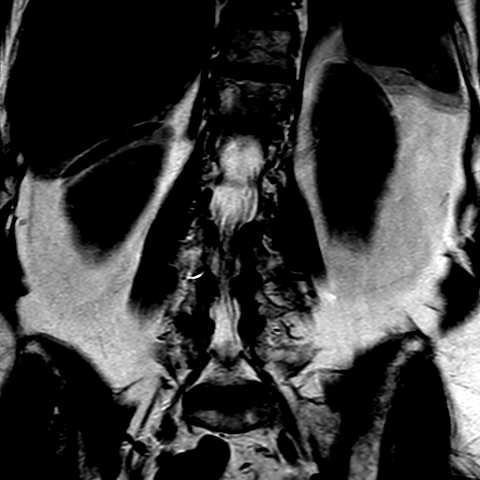
[im 7/10]
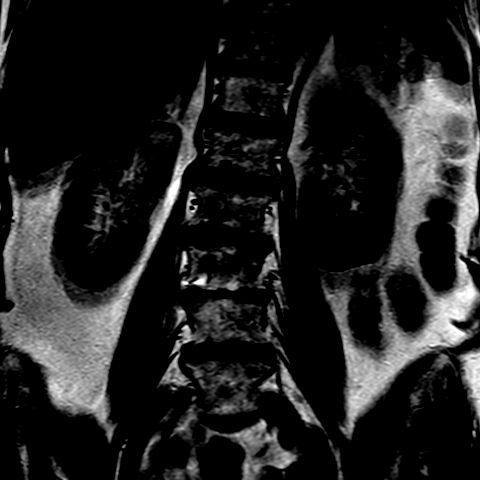
[im 10/10]
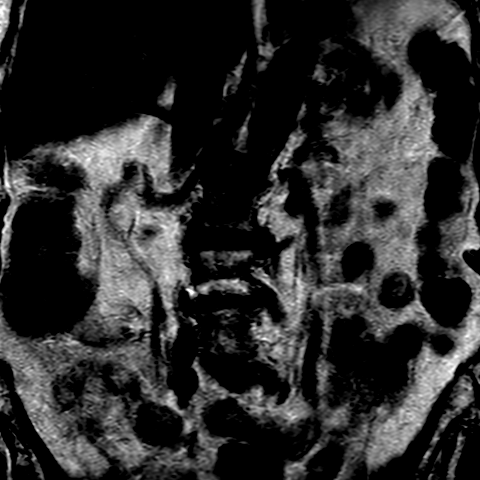

[Series 301: t1_tse_sag · sagittal · 4.0mm · 0.51mm/px · 8 of 17 slices shown]
[im 1/17]
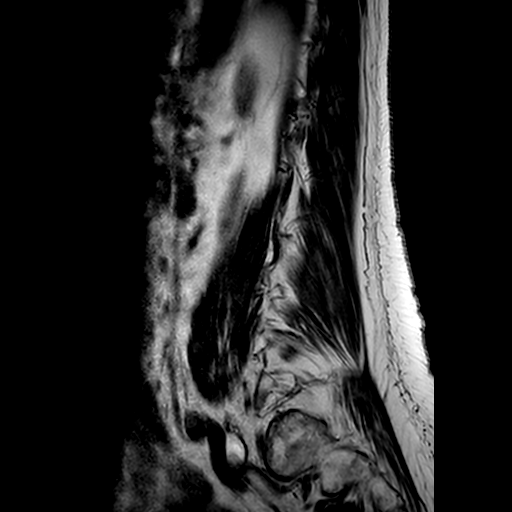
[im 3/17]
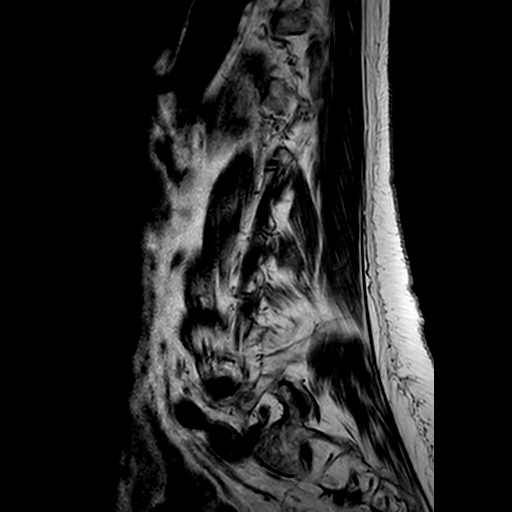
[im 5/17]
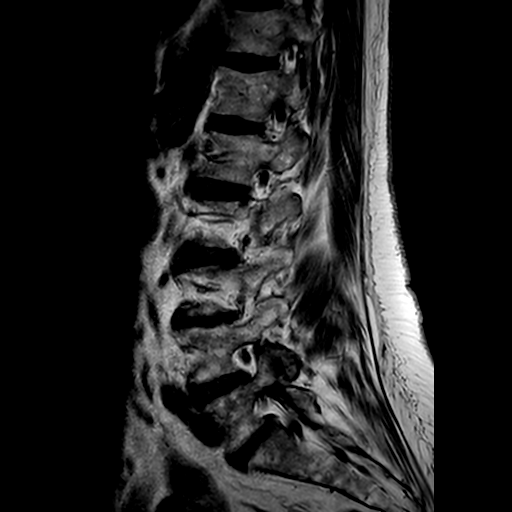
[im 7/17]
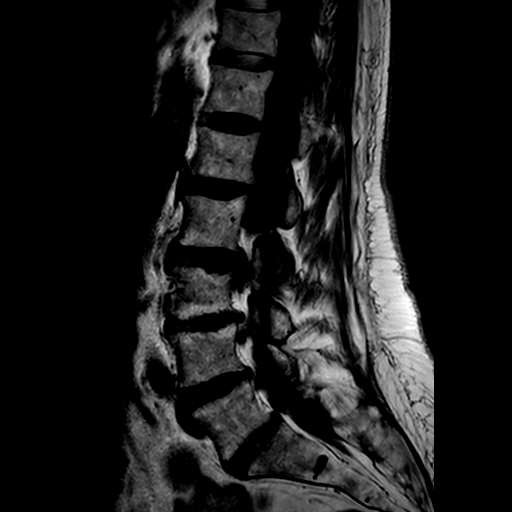
[im 10/17]
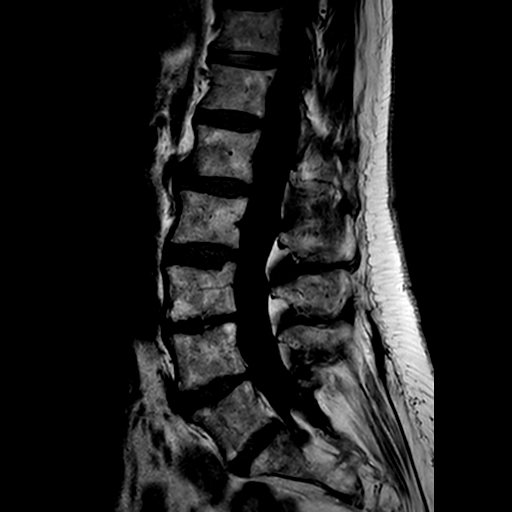
[im 12/17]
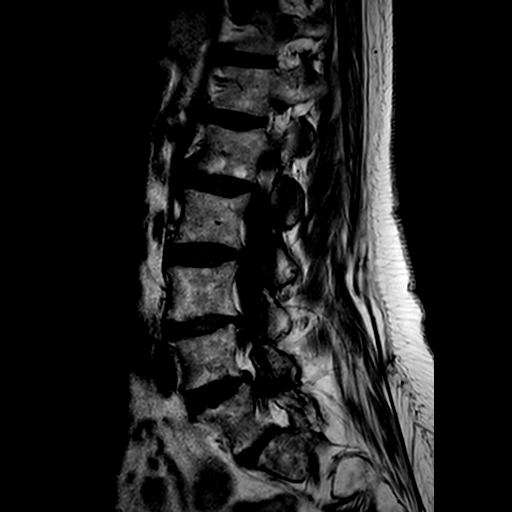
[im 14/17]
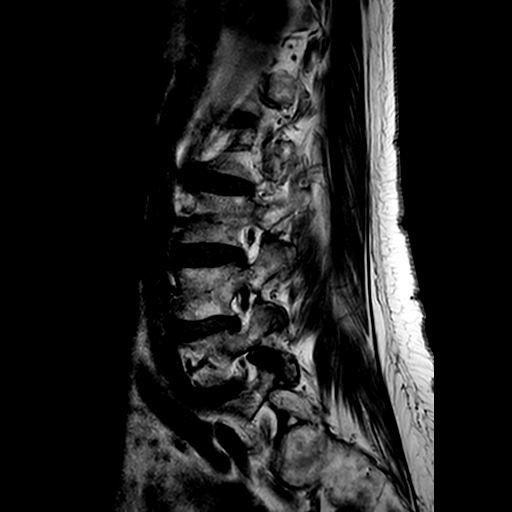
[im 17/17]
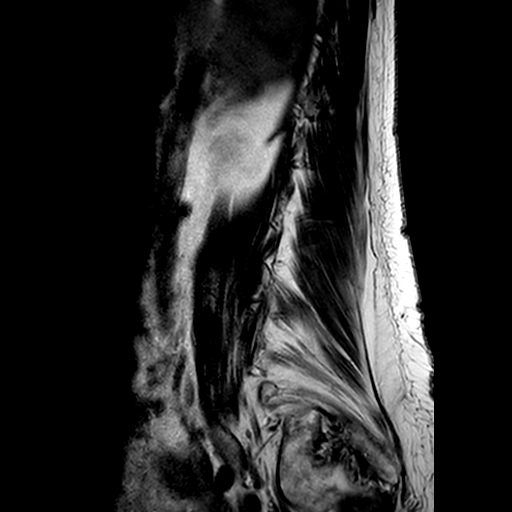

[Series 401: t2_tse_sag · sagittal · 4.0mm · 0.54mm/px · 4 of 17 slices shown]
[im 1/17]
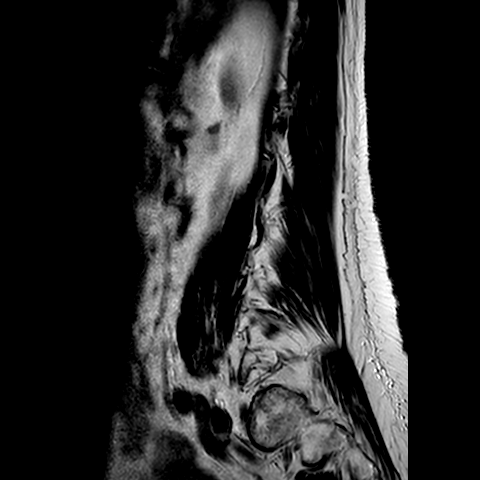
[im 3/17]
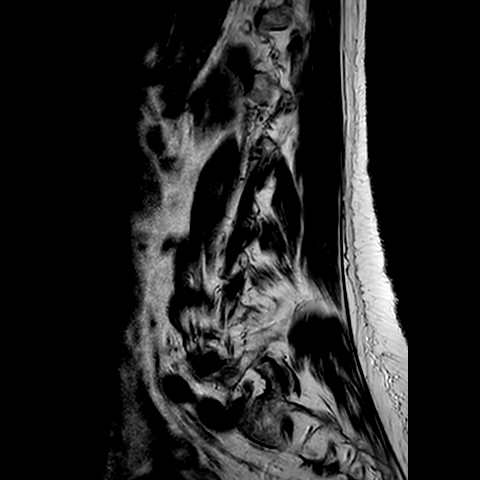
[im 10/17]
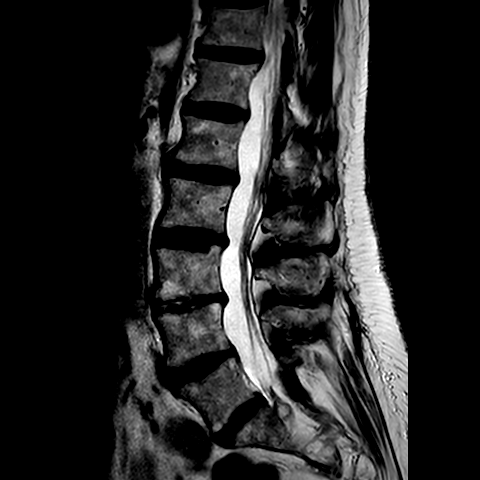
[im 14/17]
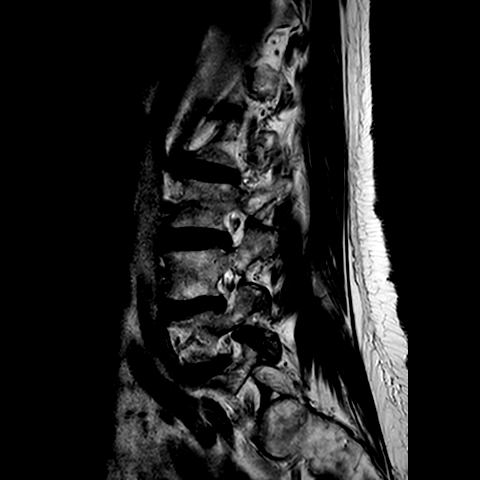

[22 of 48 positions shown; findings below may reference images not displayed]

FINDINGS: Nomenclature is based on 5 lumbar type vertebral bodies. Minimal (6%) 
loss of L3 superior endplate vertebral body height. No acute vertebral body 
fracture. Mild degenerative change and 10 degrees dextrocurvature. No 
spondylolisthesis. No pars defect. The conus tip terminates at the L1 vertebral 
body level. The aorta is normal in diameter. The posterior paraspinal soft 
tissues are negative. 0.8 cm left upper pole renal cyst. 
Modic I-II: Mild type I Modic changes anteriorly at L1-2. Type II Modic changes 
at L3-4. 
Ligamentum Flavum > 2.5 mm: All levels 
T12-L1: The disc is normal in height and signal. No disc herniation. Normal 
facets. No spinal canal or neural foraminal stenosis. 
L1-L2: Mild disc bulge, mild disc space narrowing and disc desiccation. No disc 
herniation. Normal facets. No spinal canal or neural foraminal stenosis. 
L2-L3: Disc bulge. Disc is normal in height with disc desiccation No disc 
herniation. Mild facet arthropathy and ligamentum flavum hypertrophy. Mild 
central canal/lateral recess stenosis. No neural foraminal stenosis. 
L3-L4: Disc bulge, moderate disc space narrowing and disc heterogeneity. No disc 
herniation. Mild facet arthropathy. No central canal stenosis. Mild lateral 
recess stenosis (greater on the left). No neural foraminal stenosis. 
L4-L5: Tiny central annular tear, disc bulge, mild disc space narrowing and disc 
desiccation. No disc herniation. Moderate facet arthropathy. No central canal 
stenosis. Mild lateral recess stenosis. Mild right neural foraminal stenosis. 
L5-S1: Small broad-based central disc protrusion, mild disc space narrowing and 
disc desiccation. Ankylosis of the right facet joint and mild degenerative 
change of the left. No spinal canal or neural foraminal stenosis.
IMPRESSION: 1.  Degenerative change, mild dextrocurve, minimal chronic loss of height of the 
L3 vertebral body and L4-5 tiny central annular tear. 
2.  Mild L2-3 central canal stenosis, mild lateral recess stenosis at L2-3 
through L4-5, and mild right L4-5 neural foraminal stenosis.

## 2022-01-11 IMAGING — MR MRI BRAIN WITH  IAC W/WO CONTRAST
9 of 16 series · 20 of 48 positions shown · IV contrast (Gadolinium)
Comparison: Brain MRI IAC MRI August 10, 2021

________________________________________________________________________________________________ 
MRI BRAIN WITH  IAC W/WO CONTRAST,01/11/2022 [DATE]: 
CLINICAL INDICATION: DIZZINESS AND GIDDINESS history left breast cancer [id number]
TECHNIQUE: Axial T1, Axial T2, Axial FLAIR, Diffusion weighted images, Sagittal 
T1, Enhanced Axial T1, and Enhanced coronal fat-suppressed T1 were obtained. 
Thin section axial T2 images through the IACs with sagittal and coronal 
reconstruction, thin section enhanced axial T1 images through the IACs with 
coronal reconstruction, enhanced coronal 2 mm images and enhanced axial images 
of the entire brain. FLAIR images of the brain. 7.5 mL of Gadavist were injected 
intravenously. 2.5 mL of Gadavist was discarded.  Patient was scanned on a
magnet.

[Series 102: mpr - smartbrain · axial · 1.1mm · 1.09mm/px · z∈[+0,+174]mm · 2 of 2 slices shown]
[im 1/2]
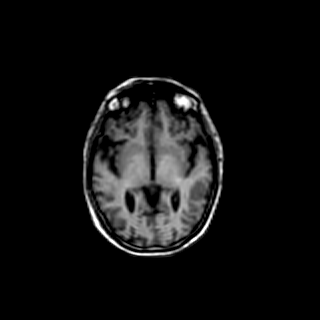
[im 2/2]
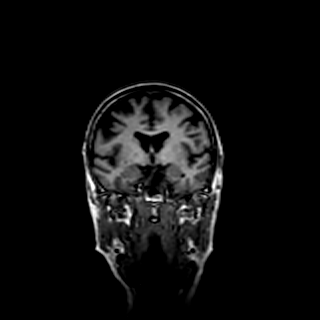

[Series 201: t1_se_sag · sagittal · 4.0mm · 0.43mm/px · 2 of 28 slices shown]
[im 1/28]
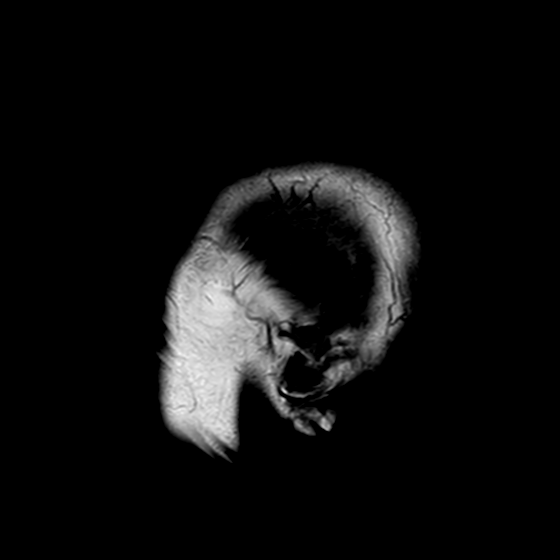
[im 28/28]
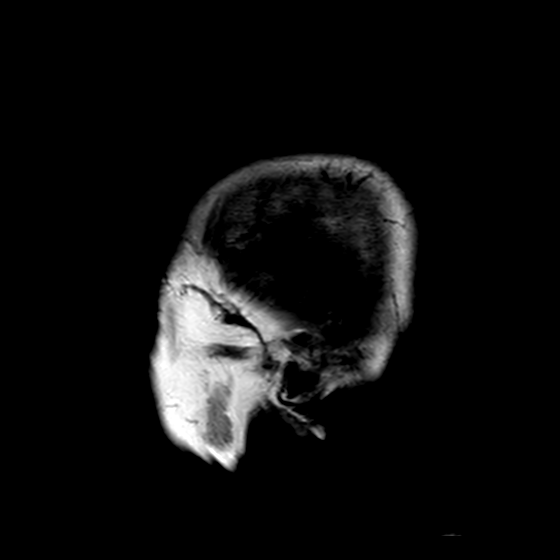

[Series 301: flair_ax · axial · 5.0mm · 0.49mm/px · z∈[-83,+73]mm · 2 of 27 slices shown]
[im 1/27]
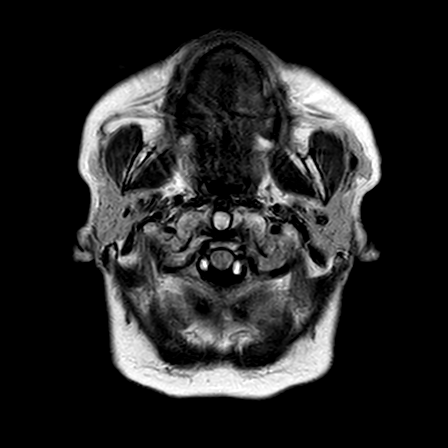
[im 27/27]
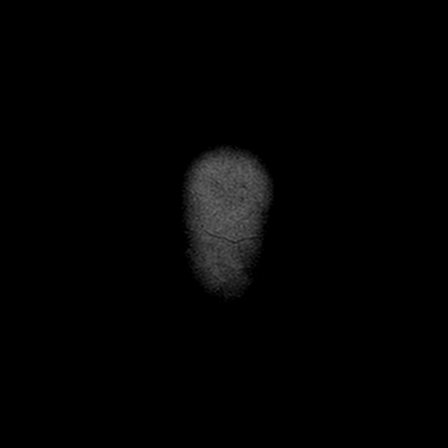

[Series 403: dadc map · axial · 5.0mm · 1.03mm/px · 1 of 27 slices shown]
[im 1/27]
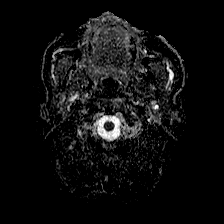

[Series 404: (id) · axial · 5.0mm · 1.03mm/px · 1 of 27 slices shown]
[im 1/27]
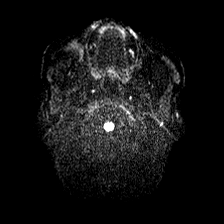

[Series 501: t1w_se_ax · axial · 5.0mm · 0.43mm/px · 1 of 27 slices shown]
[im 1/27]
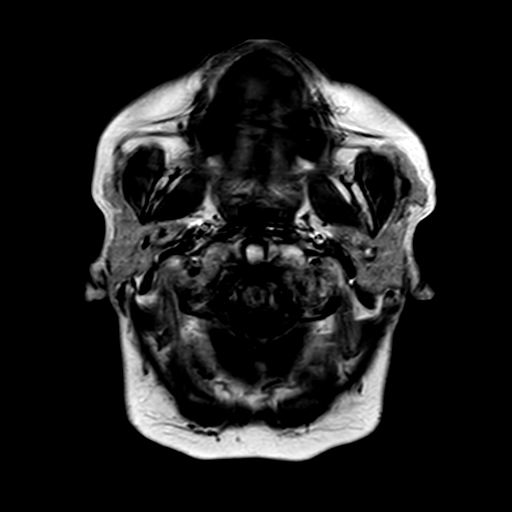

[Series 601: SWI · axial · 3.0mm · 0.40mm/px · z∈[-77,+71]mm · 8 of 200 slices shown]
[im 1/200]
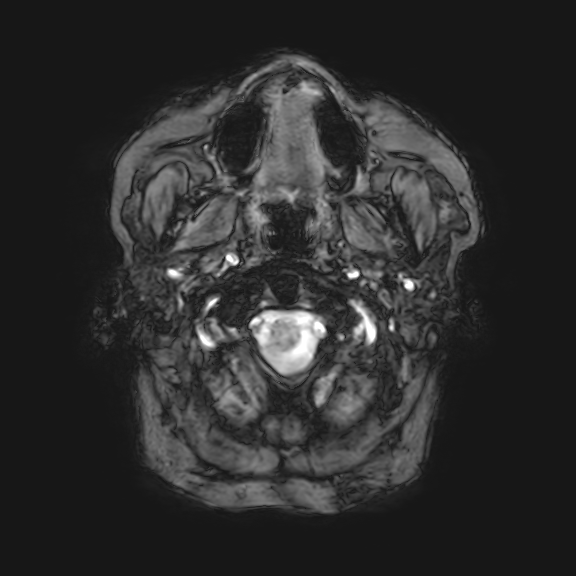
[im 23/200]
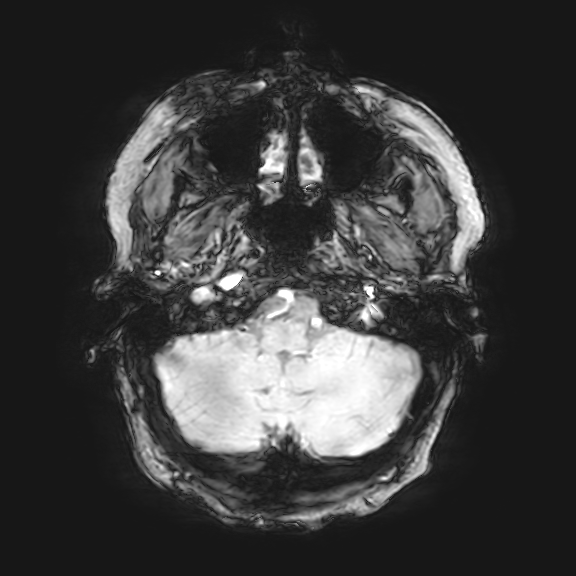
[im 67/200]
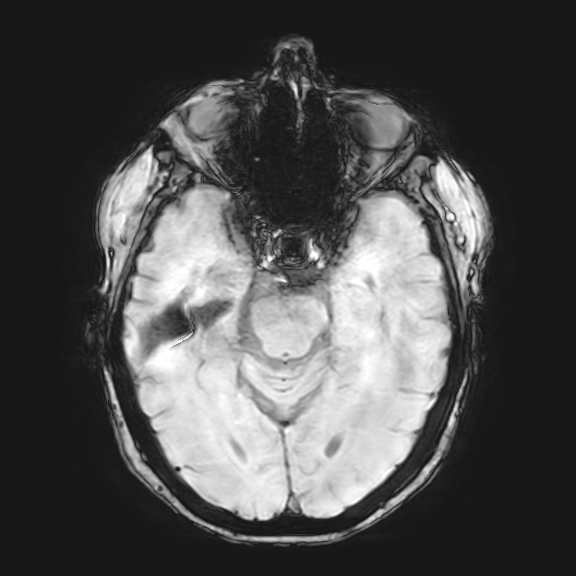
[im 89/200]
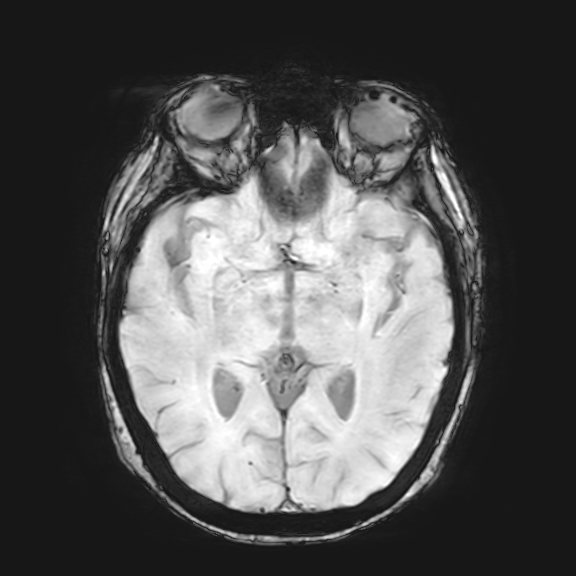
[im 111/200]
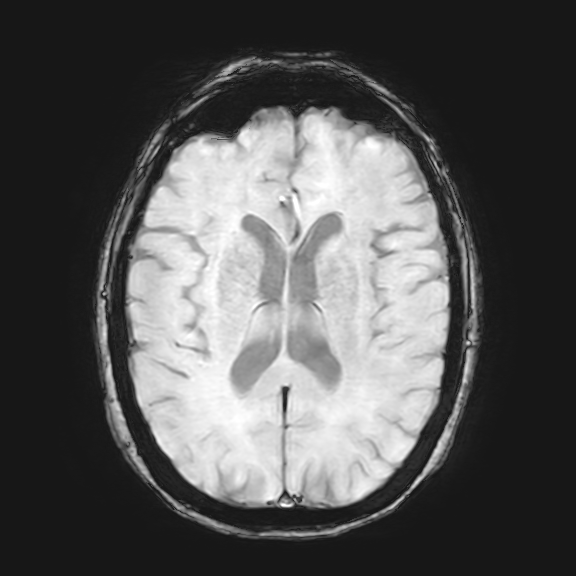
[im 133/200]
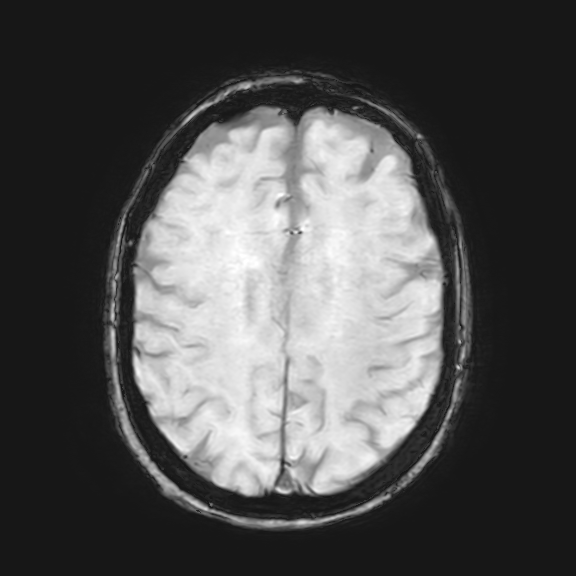
[im 177/200]
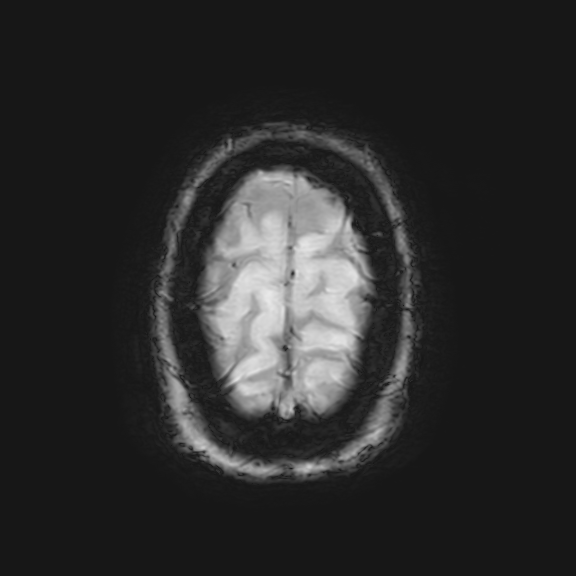
[im 200/200]
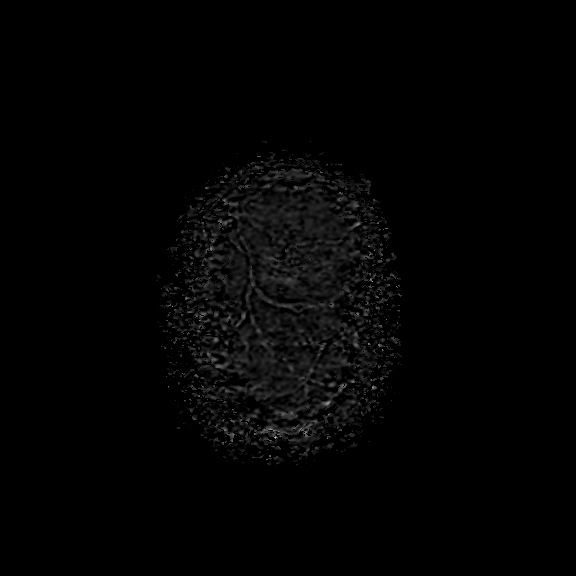

[Series 901: T1 post-contrast · axial · 5.0mm · 0.43mm/px · 1 of 27 slices shown (1 of 2)]
[im 1/27]
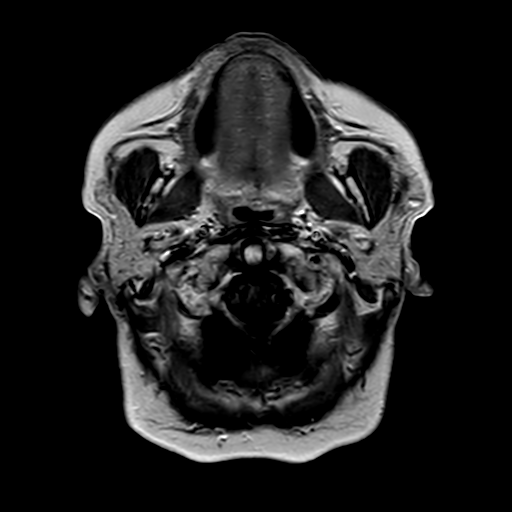

[Series 1101: T1 post-contrast · coronal · 4.0mm · 0.41mm/px · 2 of 36 slices shown (2 of 2)]
[im 1/36]
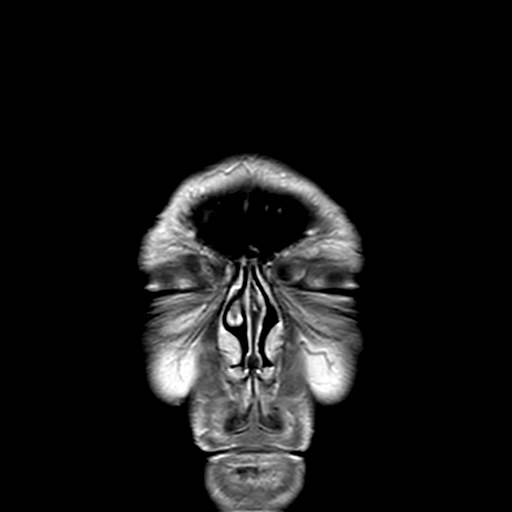
[im 36/36]
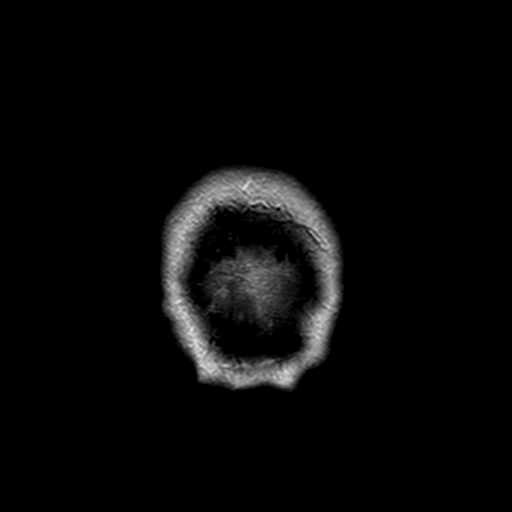

[20 of 48 positions shown; findings below may reference images not displayed]

FINDINGS: Todays study is limited by motion artifact. 
Thin section images through the IACs show no evidence for labyrinthitis or 
vestibular schwannoma. Tympanic cavities and mastoid air cells appear clear. No 
evidence for otic dysplasia 
Diffusion-weighted images are negative. There is no pathologic enhancement or 
intracranial mass. 2 mm chronic lacune in the anterior limb of the right 
internal capsule. Mild chronic white matter microangiopathic changes, 
predominantly in the final lobes, mild periatrial involvement. There is no 
hydrocephalus. There is mild to moderate cortical atrophy along the upper 
convexities, minimal cerebellar volume loss. No significant brainstem atrophy. 
There is no discrete brainstem or cerebellar lesion. 
Major intracranial arterial segments appear open. Dural sinuses are open. 
Susceptibility images are negative. 
Paranasal sinuses are clear. Craniocervical junction is open. There appears to 
been cervical fusion.
IMPRESSION: No evidence for vestibular schwannoma or other intracranial mass. 
Mild chronic appearing cerebral white matter microangiopathic changes are not 
different from the prior MRI.

## 2022-03-10 ENCOUNTER — Encounter: Admit: 2022-03-10 | Payer: PRIVATE HEALTH INSURANCE | Attending: Cardiovascular Disease | Primary: Internal Medicine

## 2022-03-13 MED ORDER — HYDROCHLOROTHIAZIDE 25 MG TABLET
25 mg | ORAL_TABLET | Freq: Every day | ORAL | 3 refills | Status: AC
Start: 2022-03-13 — End: ?

## 2022-03-13 NOTE — Telephone Encounter
Rx pended for MD signature

## 2022-08-04 ENCOUNTER — Encounter: Admit: 2022-08-04 | Payer: PRIVATE HEALTH INSURANCE | Attending: Cardiovascular Disease | Primary: Internal Medicine

## 2022-08-07 MED ORDER — AMLODIPINE 5 MG TABLET
5 mg | ORAL_TABLET | ORAL | 2 refills | Status: AC
Start: 2022-08-07 — End: ?

## 2023-07-16 ENCOUNTER — Ambulatory Visit
Admit: 2023-07-16 | Payer: Medicare (Managed Care) | Attending: Student in an Organized Health Care Education/Training Program | Primary: Internal Medicine

## 2023-07-16 MED ORDER — NIRMATRELVIR 300 MG (150 MG X2)-RITONAVIR 100 MG TABLET,DOSE PACK
300 | ORAL_TABLET | Freq: Two times a day (BID) | ORAL | 1 refills | 5.00000 days | Status: AC
Start: 2023-07-16 — End: ?
